# Patient Record
Sex: Male | Born: 1995 | Race: White | Hispanic: No | Marital: Single | Smoking: Never smoker
Health system: Southern US, Community
[De-identification: ages and names within clinical notes are randomized; demographics above are authoritative.]

## PROBLEM LIST (undated history)

## (undated) DIAGNOSIS — R112 Nausea with vomiting, unspecified: Secondary | ICD-10-CM

## (undated) DIAGNOSIS — S022XXA Fracture of nasal bones, initial encounter for closed fracture: Secondary | ICD-10-CM

## (undated) DIAGNOSIS — Z9889 Other specified postprocedural states: Secondary | ICD-10-CM

## (undated) DIAGNOSIS — S02401A Maxillary fracture, unspecified, initial encounter for closed fracture: Secondary | ICD-10-CM

## (undated) DIAGNOSIS — M549 Dorsalgia, unspecified: Secondary | ICD-10-CM

## (undated) DIAGNOSIS — Z8249 Family history of ischemic heart disease and other diseases of the circulatory system: Secondary | ICD-10-CM

## (undated) DIAGNOSIS — J302 Other seasonal allergic rhinitis: Secondary | ICD-10-CM

## (undated) HISTORY — DX: Family history of ischemic heart disease and other diseases of the circulatory system: Z82.49

## (undated) HISTORY — DX: Dorsalgia, unspecified: M54.9

---
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
## 2013-01-17 ENCOUNTER — Ambulatory Visit (INDEPENDENT_AMBULATORY_CARE_PROVIDER_SITE_OTHER): Payer: 59 | Admitting: Nurse Practitioner

## 2013-01-17 ENCOUNTER — Encounter: Payer: Self-pay | Admitting: Nurse Practitioner

## 2013-01-17 VITALS — BP 146/82 | HR 83 | Temp 98.4°F | Ht 69.0 in | Wt 172.0 lb

## 2013-01-17 DIAGNOSIS — R51 Headache: Secondary | ICD-10-CM

## 2013-01-17 DIAGNOSIS — J069 Acute upper respiratory infection, unspecified: Secondary | ICD-10-CM

## 2013-01-17 DIAGNOSIS — J029 Acute pharyngitis, unspecified: Secondary | ICD-10-CM

## 2013-01-17 LAB — POCT INFLUENZA A/B
Influenza A, POC: NEGATIVE
Influenza B, POC: NEGATIVE

## 2013-01-17 MED ORDER — AZITHROMYCIN 250 MG PO TABS: ORAL_TABLET | ORAL | Status: DC

## 2013-01-17 NOTE — Patient Instructions (Signed)

## 2013-01-17 NOTE — Progress Notes (Signed)
   Subjective:    Patient ID: Collin Ferguson, male    DOB: [birth year], 17 y.o.   MRN: [id number]  HPI  Patient in today with C/O sore throat and congestion, cough and neck soreness. Slight achiness-No fever that he knows of.    Review of Systems  Constitutional: Negative for fever and chills.  HENT: Positive for congestion, rhinorrhea, sinus pressure, sore throat, trouble swallowing and voice change. Negative for ear pain.   Respiratory: Positive for cough (nonproductive).   All other systems reviewed and are negative.       Objective:   Physical Exam  Constitutional: He appears well-developed and well-nourished.  HENT:  Right Ear: Hearing, tympanic membrane, external ear and ear canal normal.  Left Ear: Hearing, tympanic membrane, external ear and ear canal normal.  Nose: Mucosal edema and rhinorrhea present. Right sinus exhibits no maxillary sinus tenderness and no frontal sinus tenderness. Left sinus exhibits no maxillary sinus tenderness and no frontal sinus tenderness.  Mouth/Throat: Uvula is midline. Posterior oropharyngeal erythema present. No posterior oropharyngeal edema.  Eyes: Conjunctivae are normal. Pupils are equal, round, and reactive to light.  Neck: Normal range of motion.  Cardiovascular: Normal rate and normal heart sounds.   Pulmonary/Chest: Effort normal and breath sounds normal.  Abdominal: Soft.  Lymphadenopathy:    He has cervical adenopathy (bil tonsillar).  Neurological: He is alert.  Skin: Skin is warm.  Face flushed  Psychiatric: He has a normal mood and affect. His behavior is normal. Judgment and thought content normal.   BP 146/82  Pulse 83  Temp(Src) 98.4 F (36.9 C) (Oral)  Ht 5\' 9"  (1.753 m)  Wt 172 lb (78.019 kg)  BMI 25.39 kg/m2 Results for orders placed in visit on 01/17/13  POCT RAPID STREP A (OFFICE)      Result Value Range   Rapid Strep A Screen Negative  Negative  POCT INFLUENZA A/B      Result Value Range   Influenza A,  POC Negative     Influenza B, POC Negative            Assessment & Plan:   1. Sore throat   2. Headache(784.0)   3. Upper respiratory infection    Meds ordered this encounter  Medications  . azithromycin (ZITHROMAX Z-PAK) 250 MG tablet    Sig: As directed    Dispense:  6 each    Refill:  0    Order Specific Question:  Supervising Provider    Answer:  Ernestina Penna [[id number]]   1. Take meds as prescribed 2. Use a cool mist humidifier especially during the winter months and when heat has been humid. 3. Use saline nose sprays frequently 4. Saline irrigations of the nose can be very helpful if done frequently.  * 4X daily for 1 week*  * Use of a nettie pot can be helpful with this. Follow directions with this* 5. Drink plenty of fluids 6. Keep thermostat turn down low 7.For any cough or congestion  Use plain Mucinex- regular strength or max strength is fine   * Children- consult with Pharmacist for dosing 8. For fever or aces or pains- take tylenol or ibuprofen appropriate for age and weight.  * for fevers greater than 101 orally you may alternate ibuprofen and tylenol every  3 hours.   Mary-Margaret Daphine Deutscher, FNP

## 2013-01-19 ENCOUNTER — Encounter: Payer: Self-pay | Admitting: Nurse Practitioner

## 2013-01-19 ENCOUNTER — Ambulatory Visit (INDEPENDENT_AMBULATORY_CARE_PROVIDER_SITE_OTHER): Payer: 59 | Admitting: Nurse Practitioner

## 2013-01-19 VITALS — BP 127/74 | HR 70 | Temp 98.5°F | Ht 69.0 in | Wt 171.0 lb

## 2013-01-19 DIAGNOSIS — R509 Fever, unspecified: Secondary | ICD-10-CM

## 2013-01-19 DIAGNOSIS — J039 Acute tonsillitis, unspecified: Secondary | ICD-10-CM

## 2013-01-19 MED ORDER — CEFTRIAXONE SODIUM 1 G IJ SOLR
1.0000 g | INTRAMUSCULAR | Status: AC
  Administered 2013-01-19: 1 g via INTRAMUSCULAR

## 2013-01-19 NOTE — Progress Notes (Signed)
   Subjective:    Patient ID: Collin Ferguson, male    DOB: September 27, 1995, 17 y.o.   MRN: [id number]  HPI  Patient in today- had been seen 2 days ago with sore throat and fever- strep and flu were negative- He was given a  zpak anyway- no better- has sores on his throat and can't hardly swallow. Still has fever.    Review of Systems  Constitutional: Positive for fever and appetite change.  HENT: Positive for congestion, sore throat, trouble swallowing and voice change.   Respiratory: Positive for cough (slight).   Cardiovascular: Negative.   Gastrointestinal: Negative.        Objective:   Physical Exam  Constitutional: He appears well-developed and well-nourished.  HENT:  Right Ear: Hearing, tympanic membrane, external ear and ear canal normal.  Left Ear: Hearing, tympanic membrane, external ear and ear canal normal.  Nose: Mucosal edema and rhinorrhea present. Right sinus exhibits no maxillary sinus tenderness and no frontal sinus tenderness. Left sinus exhibits no maxillary sinus tenderness and no frontal sinus tenderness.  Mouth/Throat: Uvula is midline and mucous membranes are normal. Oropharyngeal exudate, posterior oropharyngeal edema, posterior oropharyngeal erythema and tonsillar abscesses present.  Cardiovascular: Normal rate, regular rhythm and normal heart sounds.   Pulmonary/Chest: Effort normal and breath sounds normal.  Skin: Skin is warm and dry.    BP 127/74  Pulse 70  Temp(Src) 98.5 F (36.9 C) (Oral)  Ht 5\' 9"  (1.753 m)  Wt 171 lb (77.565 kg)  BMI 25.24 kg/m2 Results for orders placed in visit on 01/19/13  POCT RAPID STREP A (OFFICE)      Result Value Range   Rapid Strep A Screen Negative  Negative         Assessment & Plan:   1. Fever, unspecified   2. Tonsillitis with exudate    Meds ordered this encounter  Medications  . cefTRIAXone (ROCEPHIN) injection 1 g    Sig:    Force fluids Gargle with salt water Motrin OTC  RTO  prn  Mary-Margaret Daphine Deutscher, FNP

## 2013-01-19 NOTE — Patient Instructions (Signed)

## 2013-05-01 ENCOUNTER — Encounter: Payer: Self-pay | Admitting: *Deleted

## 2013-05-01 ENCOUNTER — Telehealth: Payer: Self-pay | Admitting: *Deleted

## 2013-05-01 ENCOUNTER — Encounter: Payer: Self-pay | Admitting: Family Medicine

## 2013-05-01 ENCOUNTER — Ambulatory Visit (INDEPENDENT_AMBULATORY_CARE_PROVIDER_SITE_OTHER): Payer: 59 | Admitting: Family Medicine

## 2013-05-01 VITALS — BP 140/83 | HR 76 | Temp 98.2°F | Ht 69.0 in | Wt 167.2 lb

## 2013-05-01 DIAGNOSIS — J309 Allergic rhinitis, unspecified: Secondary | ICD-10-CM

## 2013-05-01 NOTE — Telephone Encounter (Signed)
Patient will come in for visit this afternoon further note to be written for class

## 2013-05-01 NOTE — Telephone Encounter (Signed)
Pt has allergies, headache, watery eyes, no fever. Taking OTC meds. Wants a excuse for today and will make appt for tom if not feeling better. Is this okay?

## 2013-05-01 NOTE — Progress Notes (Signed)
   Subjective:    Patient ID: Collin Ferguson, male    DOB: [birth year], 18 y.o.   MRN: [id number]  HPI Patient here today for sinus and allergies. The patient has not started back on his Claritin at this point in time.      There are no active problems to display for this patient.  Outpatient Encounter Prescriptions as of 05/01/2013  Medication Sig  . loratadine (CLARITIN) 10 MG tablet Take 10 mg by mouth daily.  . [DISCONTINUED] azithromycin (ZITHROMAX Z-PAK) 250 MG tablet As directed    Review of Systems  Constitutional: Negative.   HENT: Positive for congestion and sinus pressure.        Allergies  Eyes: Negative.   Respiratory: Negative.   Cardiovascular: Negative.   Gastrointestinal: Negative.   Endocrine: Negative.   Genitourinary: Negative.   Musculoskeletal: Negative.   Skin: Negative.   Allergic/Immunologic: Negative.   Neurological: Negative.   Hematological: Negative.   Psychiatric/Behavioral: Negative.        Objective:   Physical Exam  Nursing note and vitals reviewed. Constitutional: He appears well-developed and well-nourished. No distress.  HENT:  Head: Normocephalic and atraumatic.  Right Ear: External ear normal.  Left Ear: External ear normal.  Mouth/Throat: Oropharynx is clear and moist. No oropharyngeal exudate.  Nasal turbinate swelling and edema bilaterally there was some bleeding from the inferior turbinate on the right side  Eyes: Conjunctivae and EOM are normal. Pupils are equal, round, and reactive to light. Right eye exhibits no discharge. Left eye exhibits no discharge. No scleral icterus.  Neck: Normal range of motion. Neck supple. No thyromegaly present.  Cardiovascular: Normal rate, regular rhythm, normal heart sounds and intact distal pulses.  Exam reveals no gallop and no friction rub.   No murmur heard. Pulmonary/Chest: Effort normal and breath sounds normal. No respiratory distress. He has no wheezes. He has no rales. He  exhibits no tenderness.  Abdominal: Soft. Bowel sounds are normal.  Musculoskeletal: Normal range of motion.  Lymphadenopathy:    He has no cervical adenopathy.  Neurological: He is alert.  Skin: Skin is warm and dry. No rash noted.  Psychiatric: He has a normal mood and affect. His behavior is normal. Judgment and thought content normal.   BP 140/83  Pulse 76  Temp(Src) 98.2 F (36.8 C) (Oral)  Ht 5\' 9"  (1.753 m)  Wt 167 lb 3.2 oz (75.841 kg)  BMI 24.68 kg/m2        Assessment & Plan:   1. Allergic rhinitis Patient Instructions  Use saline nose spray,ayr, frequently through the day to hydrate here nasal passages You can purchase Flonase  over-the-counter 1-2 sprays at bedtime nightly Restart her Claritin taking one daily in the morning Drink plenty of fluids   Nyra Capeson W. Mia Milan MD

## 2013-05-01 NOTE — Telephone Encounter (Signed)
Seen in office.

## 2013-05-01 NOTE — Patient Instructions (Signed)
Use saline nose spray,ayr, frequently through the day to hydrate here nasal passages You can purchase Flonase  over-the-counter 1-2 sprays at bedtime nightly Restart her Claritin taking one daily in the morning Drink plenty of fluids

## 2013-08-10 ENCOUNTER — Ambulatory Visit (INDEPENDENT_AMBULATORY_CARE_PROVIDER_SITE_OTHER): Payer: 59 | Admitting: *Deleted

## 2013-08-10 ENCOUNTER — Other Ambulatory Visit: Payer: 59

## 2013-08-10 ENCOUNTER — Ambulatory Visit: Payer: 59 | Admitting: Nurse Practitioner

## 2013-08-10 ENCOUNTER — Other Ambulatory Visit: Payer: Self-pay | Admitting: *Deleted

## 2013-08-10 DIAGNOSIS — Z130 Encounter for screening for diseases of the blood and blood-forming organs and certain disorders involving the immune mechanism: Secondary | ICD-10-CM

## 2013-08-10 DIAGNOSIS — Z23 Encounter for immunization: Secondary | ICD-10-CM

## 2013-08-13 LAB — SICKLE CELL SCREEN

## 2013-08-13 NOTE — Addendum Note (Signed)
Addended by: Orma RenderHODGES, Zamariyah Furukawa F on: 08/13/2013 10:07 AM   Modules accepted: Orders

## 2013-08-14 LAB — SICKLE CELL SCREEN: Sickle Cell Prep: NEGATIVE

## 2013-08-30 ENCOUNTER — Encounter: Payer: Self-pay | Admitting: Physician Assistant

## 2013-08-30 ENCOUNTER — Ambulatory Visit (INDEPENDENT_AMBULATORY_CARE_PROVIDER_SITE_OTHER): Payer: 59 | Admitting: Physician Assistant

## 2013-08-30 VITALS — BP 126/79 | HR 66 | Temp 97.5°F | Ht 68.5 in | Wt 166.0 lb

## 2013-08-30 DIAGNOSIS — Z0000 Encounter for general adult medical examination without abnormal findings: Secondary | ICD-10-CM

## 2013-08-30 NOTE — Progress Notes (Signed)
Subjective:     Patient ID: Collin Ferguson, male   DOB: 11-05-1995, 18 y.o.   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   HENT: Negative.   Eyes: Negative.   Respiratory: Negative.   Cardiovascular: Negative.   Gastrointestinal: Negative.   Endocrine: Negative.   Genitourinary: Negative.   Musculoskeletal: Negative.   Skin: Negative.   Allergic/Immunologic: Negative.   Neurological: Negative.   Hematological: Negative.   Psychiatric/Behavioral: Negative.        Objective:   Physical Exam  Nursing note and vitals reviewed. Constitutional: He is oriented to person, place, and time. He appears well-developed and well-nourished.  HENT:  Head: Normocephalic.  Right Ear: External ear normal.  Left Ear: External ear normal.  Nose: Nose normal.  Mouth/Throat: Oropharynx is clear and moist.  Eyes: Conjunctivae and EOM are normal. Pupils are equal, round, and reactive to light.  Neck: Normal range of motion. Neck supple.  Cardiovascular: Normal rate, regular rhythm, normal heart sounds and intact distal pulses.   Pulmonary/Chest: Breath sounds normal.  Abdominal: Soft. Bowel sounds are normal. He exhibits no mass. There is no tenderness.  Genitourinary: Penis normal.  Testes down no hernia  Musculoskeletal: Normal range of motion. He exhibits no tenderness.  Lymphadenopathy:    He has no cervical adenopathy.  Neurological: He is alert and oriented to person, place, and time. He has normal reflexes.  Skin: Skin is warm and dry.  Psychiatric: He has a normal mood and affect. His behavior is normal. Judgment and thought content normal.       Assessment:     Physical exam    Plan:     Forms filled out today Full  activities Anticip guidance given for age Stressed the importance of hydration F/U prn

## 2013-08-30 NOTE — Patient Instructions (Signed)
Exercise to Stay Healthy Exercise helps you become and stay healthy. EXERCISE IDEAS AND TIPS Choose exercises that:  You enjoy.  Fit into your day. You do not need to exercise really hard to be healthy. You can do exercises at a slow or medium level and stay healthy. You can:  Stretch before and after working out.  Try yoga, Pilates, or tai chi.  Lift weights.  Walk fast, swim, jog, run, climb stairs, bicycle, dance, or rollerskate.  Take aerobic classes. Exercises that burn about 150 calories:  Running 1  miles in 15 minutes.  Playing volleyball for 45 to 60 minutes.  Washing and waxing a car for 45 to 60 minutes.  Playing touch football for 45 minutes.  Walking 1  miles in 35 minutes.  Pushing a stroller 1  miles in 30 minutes.  Playing basketball for 30 minutes.  Raking leaves for 30 minutes.  Bicycling 5 miles in 30 minutes.  Walking 2 miles in 30 minutes.  Dancing for 30 minutes.  Shoveling snow for 15 minutes.  Swimming laps for 20 minutes.  Walking up stairs for 15 minutes.  Bicycling 4 miles in 15 minutes.  Gardening for 30 to 45 minutes.  Jumping rope for 15 minutes.  Washing windows or floors for 45 to 60 minutes. Document Released: 02/20/2010 Document Revised: 04/12/2011 Document Reviewed: 02/20/2010 Exit[employer] Patient Information 2015 [employer]. This information is not intended to replace advice given to you by your health care provider. Make sure you discuss any questions you have with your health care provider.  

## 2013-11-14 ENCOUNTER — Ambulatory Visit (INDEPENDENT_AMBULATORY_CARE_PROVIDER_SITE_OTHER): Payer: 59

## 2013-11-14 DIAGNOSIS — Z23 Encounter for immunization: Secondary | ICD-10-CM

## 2014-11-22 ENCOUNTER — Ambulatory Visit (INDEPENDENT_AMBULATORY_CARE_PROVIDER_SITE_OTHER): Payer: 59

## 2014-11-22 DIAGNOSIS — Z23 Encounter for immunization: Secondary | ICD-10-CM | POA: Diagnosis not present

## 2014-12-30 IMAGING — CT CT MAXILLOFACIAL W/O CM
3 of 4 series · 15 of 47 positions shown, 18 images · non-contrast
Comparison: None.

CLINICAL DATA: Hit in left eye with baseball; bloody nose.

CT MAXILLOFACIAL WITHOUT CONTRAST
TECHNIQUE: Multidetector CT imaging of the maxillofacial
structures was performed. Multiplanar CT image reconstructions were
also generated.

[Series 3: orbit/facial 2.0 h30s · axial · 0.37mm/px · z∈[+1099,+1225]mm · 10 of 75 slices shown, 13 images]
[im 6/75  brain]
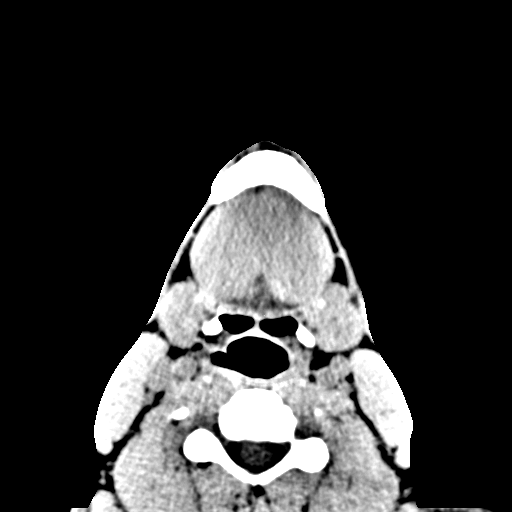
[im 6/75  bone]
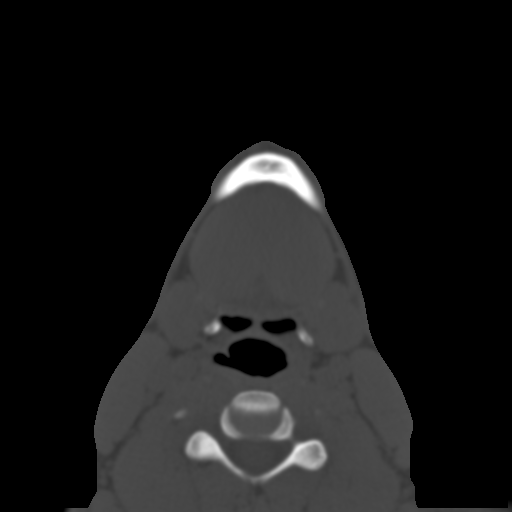
[im 13/75  bone]
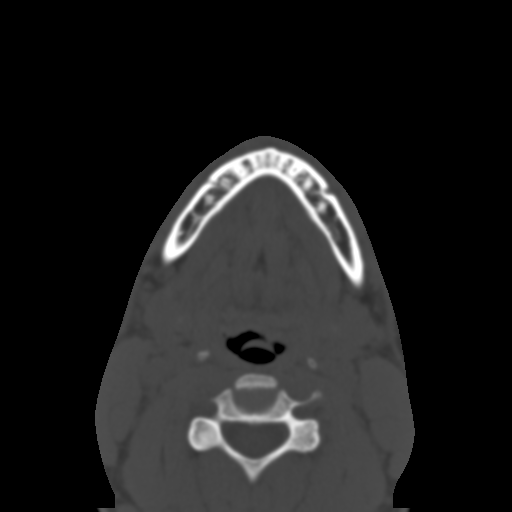
[im 21/75  bone]
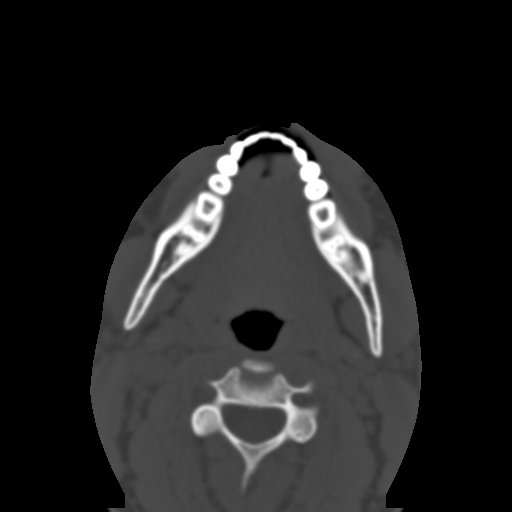
[im 26/75  bone]
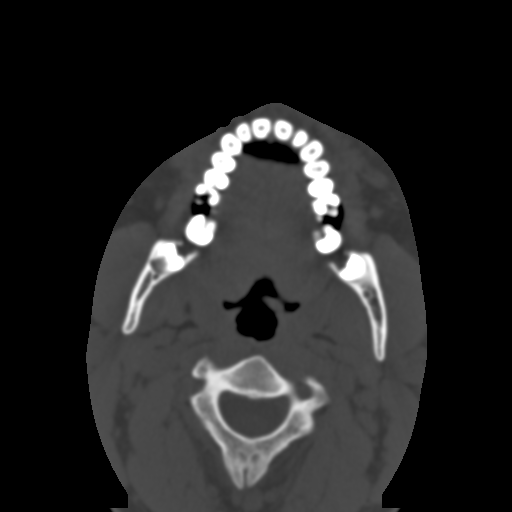
[im 34/75  brain]
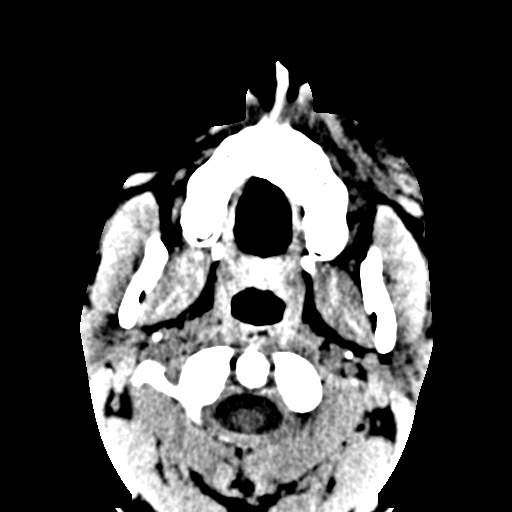
[im 34/75  bone]
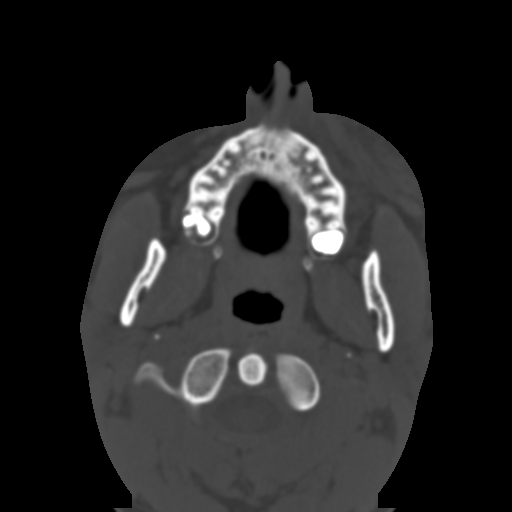
[im 41/75  bone]
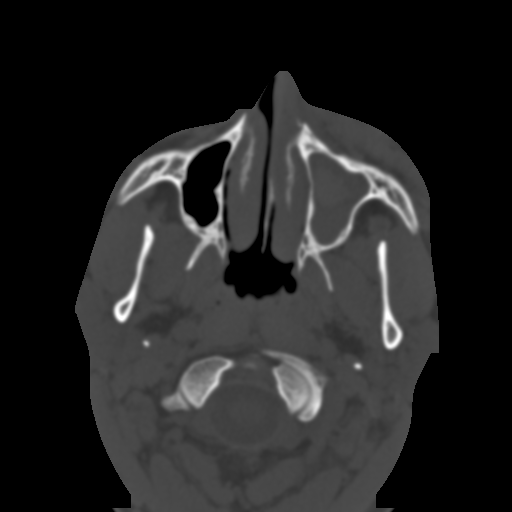
[im 49/75  bone]
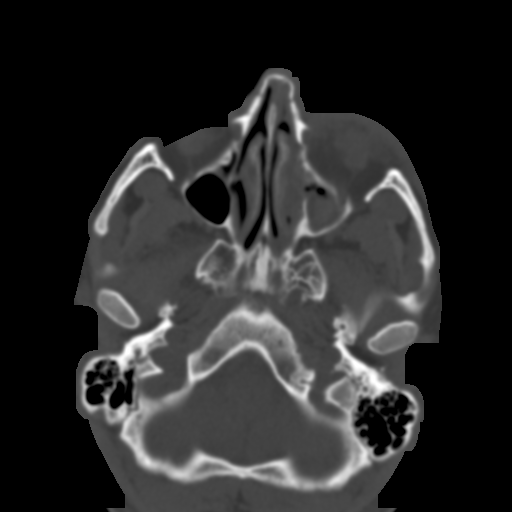
[im 57/75  bone]
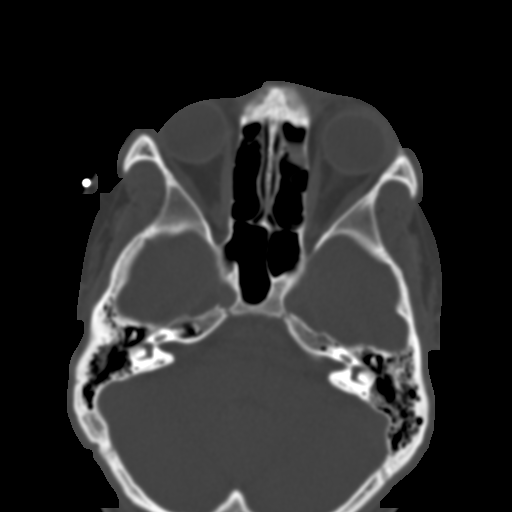
[im 62/75  brain]
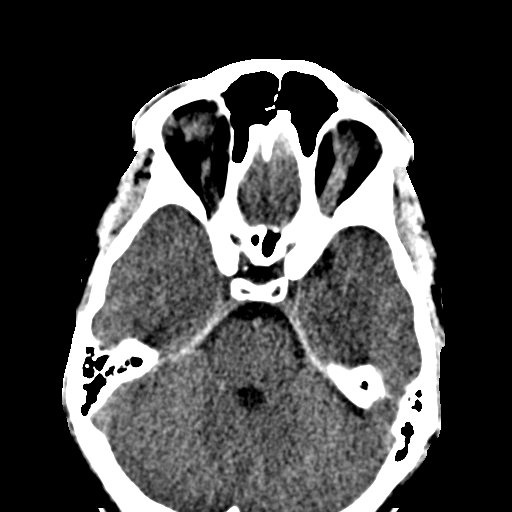
[im 62/75  bone]
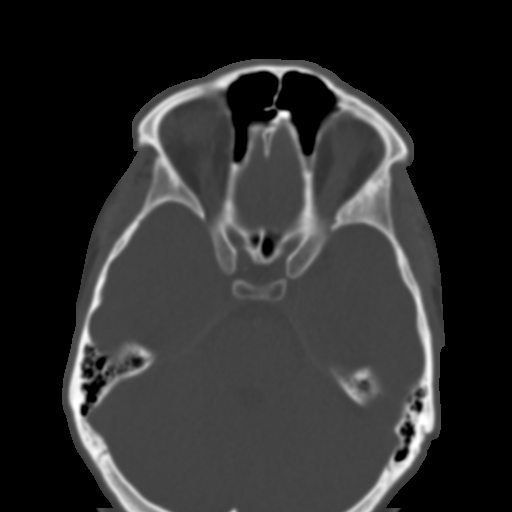
[im 69/75  bone]
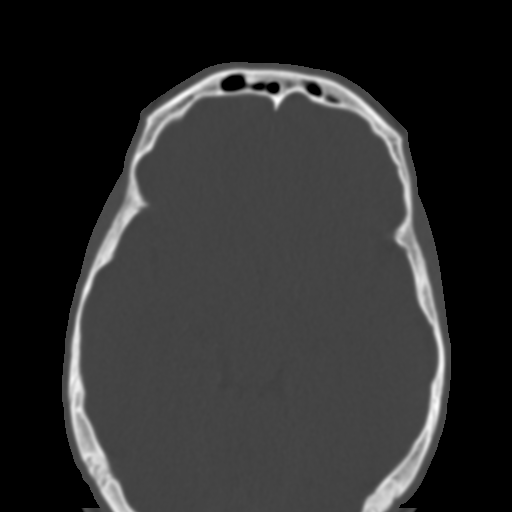

[Series 7: coronals · coronal · 0.30mm/px · 3 of 80 slices shown]
[im 27/80  bone]
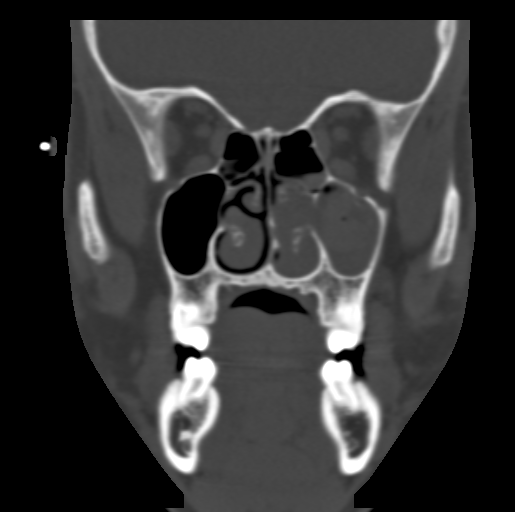
[im 36/80  bone]
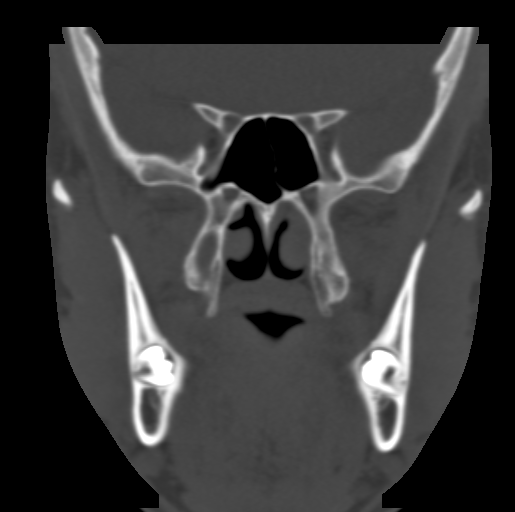
[im 44/80  bone]
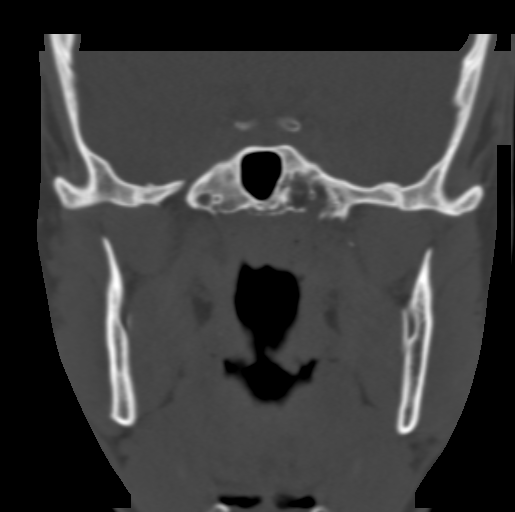

[Series 10: sagittals bone · sagittal · 0.30mm/px · 2 of 75 slices shown]
[im 25/75  bone]
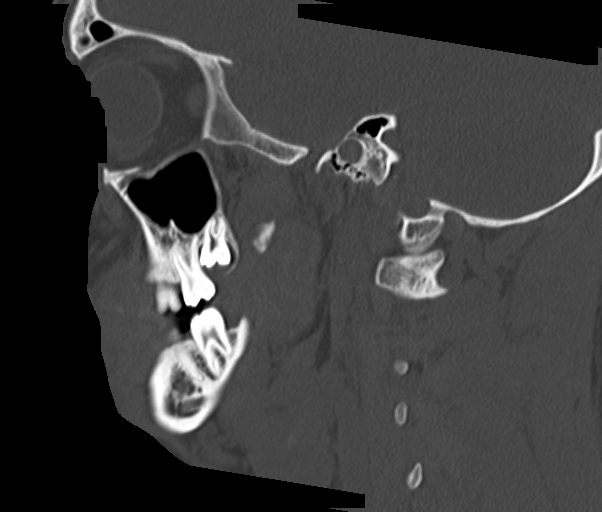
[im 50/75  bone]
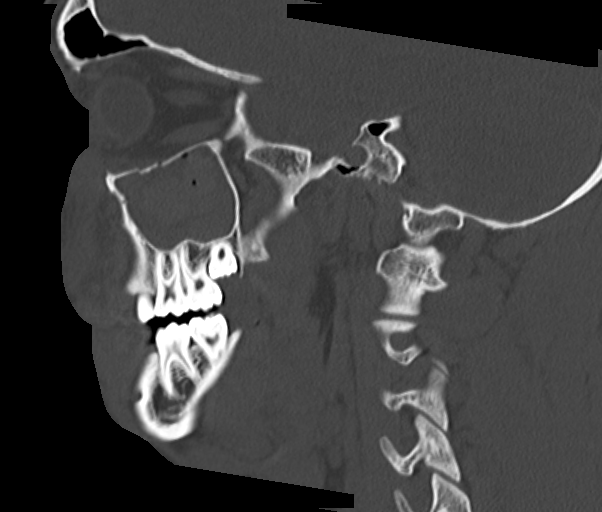

[15 of 47 positions shown; findings below may reference images not displayed]

FINDINGS: There is a mildly depressed fracture involving the left
orbital floor, with extension of the fracture along the anterior
aspect of the left maxillary sinus to the medial wall of the
maxillary sinus.  There is also an adjacent fracture involving the
left side of the nasal bone.

Blood is noted filling the left maxillary sinus.  Trace blood is
noted along the inferior aspect of the left orbit, without a
significant hematoma.  There is no evidence of herniation of
intraorbital fat.  There is also partial opacification of the left-
sided nasal passages with blood.

The mandible appears intact.  The visualized dentition demonstrates
no acute abnormality.

The right orbit appears intact.  The remaining visualized paranasal
sinuses and mastoid air cells are well-aerated.

Significant soft tissue swelling is noted about the left orbit and
anterior to the left maxilla.  The parapharyngeal fat planes are
preserved.  The nasopharynx, oropharynx and hypopharynx are
unremarkable in appearance.  The visualized portions of the
valleculae and piriform sinuses are grossly unremarkable.

The parotid and submandibular glands are within normal limits.  No
cervical lymphadenopathy is seen.
IMPRESSION: 1.  Mildly depressed fracture involving the left orbital floor,
with extension of the fracture line across the anterior aspect of
the left maxillary sinus to the medial wall of the maxillary sinus.
2.  Adjacent fracture involving the left side of the nasal bone,
with minimal displacement.
3.  Blood noted filling the left maxillary sinus; trace blood along
the inferior aspect of the left orbit, without significant
hematoma.  No evidence of herniation of intraorbital fat.  Partial
opacification of the left-sided nasal passages with blood.
4.  Significant soft tissue swelling about the left orbit and
anterior to the left maxilla.

to Dr. Vulovic Solidoro, who verbally acknowledged these results.

## 2015-01-14 ENCOUNTER — Ambulatory Visit (INDEPENDENT_AMBULATORY_CARE_PROVIDER_SITE_OTHER): Payer: 59 | Admitting: Family Medicine

## 2015-01-14 ENCOUNTER — Encounter: Payer: Self-pay | Admitting: Family Medicine

## 2015-01-14 VITALS — BP 121/65 | HR 62 | Temp 97.3°F | Ht 68.5 in | Wt 186.0 lb

## 2015-01-14 DIAGNOSIS — J0100 Acute maxillary sinusitis, unspecified: Secondary | ICD-10-CM | POA: Diagnosis not present

## 2015-01-14 DIAGNOSIS — J029 Acute pharyngitis, unspecified: Secondary | ICD-10-CM

## 2015-01-14 LAB — POCT RAPID STREP A (OFFICE): Rapid Strep A Screen: NEGATIVE

## 2015-01-14 MED ORDER — AMOXICILLIN-POT CLAVULANATE 875-125 MG PO TABS: 1.0000 | ORAL_TABLET | Freq: Two times a day (BID) | ORAL | Status: DC

## 2015-01-14 MED ORDER — PSEUDOEPHEDRINE-GUAIFENESIN ER 120-1200 MG PO TB12: 1.0000 | ORAL_TABLET | Freq: Two times a day (BID) | ORAL | Status: DC

## 2015-01-14 MED ORDER — BETAMETHASONE SOD PHOS & ACET 6 (3-3) MG/ML IJ SUSP
6.0000 mg | Freq: Once | INTRAMUSCULAR | Status: AC
  Administered 2015-01-14: 6 mg via INTRAMUSCULAR

## 2015-01-14 NOTE — Addendum Note (Signed)
Addended by: Prescott GumLAND, Teja Costen M on: 01/14/2015 05:23 PM   Modules accepted: Orders, SmartSet

## 2015-01-14 NOTE — Progress Notes (Signed)
Subjective:  Patient ID: Collin Ferguson, male    DOB: February 02, 1995  Age: 19 y.o. MRN: [id number]  CC: URI   HPI Collin IbaCharles D Roehr presents for Patient presents with upper respiratory congestion. Rhinorrhea that is frequently purulent. There is moderate sore throat. Patient reports only minimal cough. There are chills and sweats. The patient denies being short of breath. Onset was 3 days ago. Gradually worsening. Was outside 3 days ago all day hunting while weather was turning colder.  History Collin Ferguson has a past medical history of Seasonal allergies; PONV (postoperative nausea and vomiting); Maxillary fracture (HCC) (04/05/2012); and Nasal fracture (04/05/2012).   He has past surgical history that includes Femoral traction pin (Left, 02/14/1999); Extremity wire/pin removal (Left, 03/12/1999); Closed reduction nasal fracture (N/A, 04/11/2012); and Turbinate reduction (N/A, 04/11/2012).   His family history includes Heart disease in his father; Hypertension in his father.He reports that he has never smoked. He has never used smokeless tobacco. He reports that he does not drink alcohol or use illicit drugs.  No current outpatient prescriptions on file prior to visit.   No current facility-administered medications on file prior to visit.    ROS Review of Systems  Constitutional: Negative for fever, chills, activity change and appetite change.  HENT: Positive for congestion, ear pain (mostly getting a popping sensation frequently.), postnasal drip, rhinorrhea and sinus pressure. Negative for ear discharge, hearing loss, nosebleeds, sneezing and trouble swallowing.   Respiratory: Negative for cough, chest tightness and shortness of breath.   Cardiovascular: Negative for chest pain and palpitations.  Skin: Negative for rash.    Objective:  BP 121/65 mmHg  Pulse 62  Temp(Src) 97.3 F (36.3 C) (Oral)  Ht 5' 8.5" (1.74 m)  Wt 186 lb (84.369 kg)  BMI 27.87 kg/m2  SpO2 99%  Physical Exam    Constitutional: He appears well-developed and well-nourished.  HENT:  Head: Normocephalic and atraumatic.  Right Ear: External ear normal. A middle ear effusion is present. No decreased hearing is noted.  Left Ear: Tympanic membrane and external ear normal. No decreased hearing is noted.  Nose: Mucosal edema (with marked erythema and exudate) present. Right sinus exhibits no frontal sinus tenderness. Left sinus exhibits no frontal sinus tenderness.  Mouth/Throat: Mucous membranes are normal. Oropharyngeal exudate, posterior oropharyngeal edema and posterior oropharyngeal erythema present. No tonsillar abscesses.  Neck: No Brudzinski's sign noted.  Pulmonary/Chest: Breath sounds normal. No respiratory distress.  Lymphadenopathy:       Head (right side): No preauricular adenopathy present.       Head (left side): No preauricular adenopathy present.    He has cervical adenopathy.   Results for orders placed or performed in visit on 01/14/15  POCT rapid strep A  Result Value Ref Range   Rapid Strep A Screen Negative Negative    Assessment & Plan:   Collin Ferguson was seen today for uri.  Diagnoses and all orders for this visit:  Sore throat -     POCT rapid strep A -     betamethasone acetate-betamethasone sodium phosphate (CELESTONE) injection 6 mg; Inject 1 mL (6 mg total) into the muscle once.  Acute maxillary sinusitis, recurrence not specified -     betamethasone acetate-betamethasone sodium phosphate (CELESTONE) injection 6 mg; Inject 1 mL (6 mg total) into the muscle once.  Other orders -     amoxicillin-clavulanate (AUGMENTIN) 875-125 MG tablet; Take 1 tablet by mouth 2 (two) times daily. Take all of this medication -  Pseudoephedrine-Guaifenesin [id number] MG TB12; Take 1 tablet by mouth 2 (two) times daily. For congestion   I am having Mr. Malter start on amoxicillin-clavulanate and Pseudoephedrine-Guaifenesin. We will continue to administer betamethasone  acetate-betamethasone sodium phosphate.  Meds ordered this encounter  Medications  . amoxicillin-clavulanate (AUGMENTIN) 875-125 MG tablet    Sig: Take 1 tablet by mouth 2 (two) times daily. Take all of this medication    Dispense:  20 tablet    Refill:  0  . Pseudoephedrine-Guaifenesin [id number] MG TB12    Sig: Take 1 tablet by mouth 2 (two) times daily. For congestion    Dispense:  20 each    Refill:  0  . betamethasone acetate-betamethasone sodium phosphate (CELESTONE) injection 6 mg    Sig:      Follow-up: Return if symptoms worsen or fail to improve.  Mechele Claude, M.D.

## 2015-01-14 NOTE — Addendum Note (Signed)
Addended by: Bearl MulberryUTHERFORD, NATALIE K on: 01/14/2015 01:55 PM   Modules accepted: Kipp BroodSmartSet

## 2015-01-16 LAB — CULTURE, GROUP A STREP

## 2015-01-17 NOTE — Progress Notes (Signed)
Patient aware.

## 2016-06-02 ENCOUNTER — Encounter: Payer: Self-pay | Admitting: Family Medicine

## 2016-06-02 ENCOUNTER — Ambulatory Visit (INDEPENDENT_AMBULATORY_CARE_PROVIDER_SITE_OTHER): Payer: 59 | Admitting: Family Medicine

## 2016-06-02 VITALS — BP 129/87 | HR 104 | Temp 98.0°F | Ht 68.0 in | Wt 175.0 lb

## 2016-06-02 DIAGNOSIS — J301 Allergic rhinitis due to pollen: Secondary | ICD-10-CM | POA: Diagnosis not present

## 2016-06-02 MED ORDER — FEXOFENADINE-PSEUDOEPHED ER 180-240 MG PO TB24: 1.0000 | ORAL_TABLET | Freq: Every evening | ORAL | 11 refills | Status: AC

## 2016-06-02 MED ORDER — FLUOCINONIDE-E 0.05 % EX CREA: 1.0000 "application " | TOPICAL_CREAM | Freq: Two times a day (BID) | CUTANEOUS | 5 refills | Status: DC

## 2016-06-02 MED ORDER — BETAMETHASONE SOD PHOS & ACET 6 (3-3) MG/ML IJ SUSP: 6.0000 mg | Freq: Once | INTRAMUSCULAR | Status: DC

## 2016-06-02 NOTE — Progress Notes (Addendum)
Subjective:  Patient ID: Kegan Shepardson LINDAHL, male    DOB: [birth year]  Age: 21 y.o. MRN: [id number]  CC: Sinusitis (pt here today c/o runny nose, sneezing, itchy/watery eyes and swelling under his eyes.)   HPI JEDRICK HUTCHERSON presents for Patient has allergic rhinitis symptoms including sneezing frequently sniffling, clear rhinorrhea, watery and itchy eyes. There has been no fever no chills no sweats. No earaches. There is some scratchy throat but no sore throat or difficulty swallowing. There is some nasal congestion. He also has from patchy rash on the neck. Patient says he gets these symptoms every year in the spring. He's tried Claritin and Zyrtec as well as several others.  History Robertlee has a past medical history of Maxillary fracture (HCC) (04/05/2012); Nasal fracture (04/05/2012); PONV (postoperative nausea and vomiting); and Seasonal allergies.   He has a past surgical history that includes Femoral traction pin (Left, 02/14/1999); Extremity wire/pin removal (Left, 03/12/1999); Closed reduction nasal fracture (N/A, 04/11/2012); and Turbinate reduction (N/A, 04/11/2012).   His family history includes Heart disease in his father; Hypertension in his father.He reports that he has never smoked. He has never used smokeless tobacco. He reports that he does not drink alcohol or use drugs.  No current outpatient prescriptions on file prior to visit.   No current facility-administered medications on file prior to visit.     ROS Review of Systems  Constitutional: Negative for chills, diaphoresis and fever.  HENT: Positive for rhinorrhea and sneezing.   Eyes: Positive for redness and itching. Negative for visual disturbance.  Respiratory: Negative for cough and shortness of breath.   Cardiovascular: Negative for chest pain.  Gastrointestinal: Negative for abdominal pain.  Musculoskeletal: Negative for arthralgias and myalgias.  Skin: Negative for rash.  Neurological: Negative for weakness  and headaches.    Objective:  BP 129/87   Pulse (!) 104   Temp 98 F (36.7 C) (Oral)   Ht  (1.727 m)   Wt 175 lb (79.4 kg)   BMI 26.61 kg/m   Physical Exam  Constitutional: He is oriented to person, place, and time. He appears well-developed and well-nourished.  HENT:  Head: Normocephalic and atraumatic.  Right Ear: External ear normal.  Left Ear: External ear normal.  Mouth/Throat: No oropharyngeal exudate or posterior oropharyngeal erythema.  Eyes: Pupils are equal, round, and reactive to light.  Neck: Normal range of motion. Neck supple.  Cardiovascular: Normal rate and regular rhythm.   No murmur heard. Pulmonary/Chest: Breath sounds normal. No respiratory distress.  Neurological: He is alert and oriented to person, place, and time.  Vitals reviewed.   Assessment & Plan:   Gearald was seen today for sinusitis.  Diagnoses and all orders for this visit:  Seasonal allergic rhinitis due to pollen -     betamethasone acetate-betamethasone sodium phosphate (CELESTONE) injection 6 mg; Inject 1 mL (6 mg total) into the muscle once.  Other orders -     fexofenadine-pseudoephedrine (ALLEGRA-D 24) 180-240 MG 24 hr tablet; Take 1 tablet by mouth every evening. For allergy and congestion -     fluocinonide-emollient (LIDEX-E) 0.05 % cream; Apply 1 application topically 2 (two) times daily. To affected areas   I have discontinued Mr. Bowdish amoxicillin-clavulanate and Pseudoephedrine-Guaifenesin. I am also having him start on fexofenadine-pseudoephedrine and fluocinonide-emollient. We will continue to administer betamethasone acetate-betamethasone sodium phosphate.  Meds ordered this encounter  Medications  . betamethasone acetate-betamethasone sodium phosphate (CELESTONE) injection 6 mg  . fexofenadine-pseudoephedrine (ALLEGRA-D 24) 180-240 MG 24  hr tablet    Sig: Take 1 tablet by mouth every evening. For allergy and congestion    Dispense:  30 tablet    Refill:  11   . fluocinonide-emollient (LIDEX-E) 0.05 % cream    Sig: Apply 1 application topically 2 (two) times daily. To affected areas    Dispense:  60 g    Refill:  5     Follow-up: Return if symptoms worsen or fail to improve.  Mechele Claude, M.D.

## 2016-06-02 NOTE — Addendum Note (Signed)
Addended by: Mechele Claude on: 06/02/2016 03:39 PM   Modules accepted: Orders

## 2016-08-02 DIAGNOSIS — M9903 Segmental and somatic dysfunction of lumbar region: Secondary | ICD-10-CM | POA: Diagnosis not present

## 2016-08-03 DIAGNOSIS — M9903 Segmental and somatic dysfunction of lumbar region: Secondary | ICD-10-CM | POA: Diagnosis not present

## 2016-08-05 DIAGNOSIS — M9903 Segmental and somatic dysfunction of lumbar region: Secondary | ICD-10-CM | POA: Diagnosis not present

## 2016-08-09 DIAGNOSIS — M9903 Segmental and somatic dysfunction of lumbar region: Secondary | ICD-10-CM | POA: Diagnosis not present

## 2016-08-11 DIAGNOSIS — M9903 Segmental and somatic dysfunction of lumbar region: Secondary | ICD-10-CM | POA: Diagnosis not present

## 2016-08-12 DIAGNOSIS — M9903 Segmental and somatic dysfunction of lumbar region: Secondary | ICD-10-CM | POA: Diagnosis not present

## 2016-08-16 DIAGNOSIS — M9903 Segmental and somatic dysfunction of lumbar region: Secondary | ICD-10-CM | POA: Diagnosis not present

## 2016-08-19 DIAGNOSIS — M9903 Segmental and somatic dysfunction of lumbar region: Secondary | ICD-10-CM | POA: Diagnosis not present

## 2016-08-20 ENCOUNTER — Ambulatory Visit (INDEPENDENT_AMBULATORY_CARE_PROVIDER_SITE_OTHER): Payer: 59

## 2016-08-20 ENCOUNTER — Encounter: Payer: Self-pay | Admitting: Family Medicine

## 2016-08-20 ENCOUNTER — Ambulatory Visit (INDEPENDENT_AMBULATORY_CARE_PROVIDER_SITE_OTHER): Payer: 59 | Admitting: Family Medicine

## 2016-08-20 VITALS — BP 123/74 | HR 60 | Temp 96.9°F | Ht 68.0 in | Wt 180.0 lb

## 2016-08-20 DIAGNOSIS — S9031XA Contusion of right foot, initial encounter: Secondary | ICD-10-CM | POA: Diagnosis not present

## 2016-08-20 NOTE — Progress Notes (Signed)
BP 123/74   Pulse 60   Temp (!) 96.9 F (36.1 C) (Oral)   Ht 5\' 8"  (1.727 m)   Wt 180 lb (81.6 kg)   BMI 27.37 kg/m    Subjective:    Patient ID: Collin Ferguson, male    DOB: 02-Aug-1995, 21 y.o.   MRN: [id number]  HPI: Collin Ferguson is a 21 y.o. male presenting on 08/20/2016 for Right foot pain (pain in top of right foot, putting in fence posts and slammed fence post and drive onto top of right foot)   HPI Right foot Pain Patient has been having right foot pain since yesterday when he was driving fence posts and axilla to the driver too high and came down on top of his right foot. He is able to weight-bear on it but it is painful on the medial top aspect of the foot where he landed. He does have a little bit of a scrape there. The pain is moderate today. He denies any fevers or chills or numbness or weakness. He denies any pain radiating anywhere else. He mainly has pain with movement of the foot and when he is putting pressure to push off of his toes from that foot.  Relevant past medical, surgical, family and social history reviewed and updated as indicated. Interim medical history since our last visit reviewed. Allergies and medications reviewed and updated.  Review of Systems  Constitutional: Negative for chills and fever.  Respiratory: Negative for shortness of breath and wheezing.   Cardiovascular: Negative for chest pain and leg swelling.  Musculoskeletal: Positive for arthralgias. Negative for back pain and gait problem.  Skin: Positive for color change and wound. Negative for rash.  All other systems reviewed and are negative.   Per HPI unless specifically indicated above   Allergies as of 08/20/2016   No Known Allergies     Medication List       Accurate as of 08/20/16  9:59 AM. Always use your most recent med list.          fexofenadine-pseudoephedrine 180-240 MG 24 hr tablet Commonly known as:  ALLEGRA-D 24 Take 1 tablet by mouth every evening.  For allergy and congestion          Objective:    BP 123/74   Pulse 60   Temp (!) 96.9 F (36.1 C) (Oral)   Ht 5\' 8"  (1.727 m)   Wt 180 lb (81.6 kg)   BMI 27.37 kg/m   Wt Readings from Last 3 Encounters:  08/20/16 180 lb (81.6 kg)  06/02/16 175 lb (79.4 kg)  01/14/15 186 lb (84.4 kg)    Physical Exam  Constitutional: He is oriented to person, place, and time. He appears well-developed and well-nourished. No distress.  Eyes: Conjunctivae are normal. No scleral icterus.  Musculoskeletal: Normal range of motion. He exhibits no edema.       Right foot: There is tenderness, bony tenderness (Tenderness overlying the first and second med and tarsals on top of his foot but not as much pain underneath) and swelling. There is normal range of motion, normal capillary refill, no deformity and no laceration.       Feet:  Neurological: He is alert and oriented to person, place, and time. Coordination normal.  Skin: Skin is warm and dry. No rash noted. He is not diaphoretic.  Psychiatric: He has a normal mood and affect. His behavior is normal.  Nursing note and vitals reviewed.   Right foot x-ray:  Right foot x-ray shows no sign of fractures or acute bony abnormalities. Await final read from radiologist    Assessment & Plan:   Problem List Items Addressed This Visit    None    Visit Diagnoses    Contusion of right foot, initial encounter    -  Primary   No sign of fracture seen on x-ray, await final read from radiologist, instructed patient to ice and elevate over the weekend   Relevant Orders   DG Foot Complete Right       Follow up plan: Return if symptoms worsen or fail to improve.  Counseling provided for all of the vaccine components Orders Placed This Encounter  Procedures  . DG Foot Complete Right    Arville CareJoshua Kynadee Dam, MD [facility] 08/20/2016, 9:59 AM

## 2016-09-28 DIAGNOSIS — L709 Acne, unspecified: Secondary | ICD-10-CM | POA: Diagnosis not present

## 2016-09-28 DIAGNOSIS — Z79899 Other long term (current) drug therapy: Secondary | ICD-10-CM | POA: Diagnosis not present

## 2016-10-15 ENCOUNTER — Ambulatory Visit: Payer: 59 | Admitting: Pediatrics

## 2016-10-22 ENCOUNTER — Encounter: Payer: Self-pay | Admitting: Family Medicine

## 2016-10-22 ENCOUNTER — Ambulatory Visit (INDEPENDENT_AMBULATORY_CARE_PROVIDER_SITE_OTHER): Payer: 59 | Admitting: Family Medicine

## 2016-10-22 VITALS — BP 107/70 | HR 63 | Temp 97.1°F | Ht 68.0 in | Wt 180.0 lb

## 2016-10-22 DIAGNOSIS — R42 Dizziness and giddiness: Secondary | ICD-10-CM | POA: Diagnosis not present

## 2016-10-22 LAB — CMP14+EGFR
A/G RATIO: 2 (ref 1.2–2.2)
ALBUMIN: 4.5 g/dL (ref 3.5–5.5)
ALT: 10 IU/L (ref 0–44)
AST: 12 IU/L (ref 0–40)
Alkaline Phosphatase: 55 IU/L (ref 39–117)
BILIRUBIN TOTAL: 0.9 mg/dL (ref 0.0–1.2)
BUN / CREAT RATIO: 23 — AB (ref 9–20)
BUN: 22 mg/dL — ABNORMAL HIGH (ref 6–20)
CALCIUM: 9.7 mg/dL (ref 8.7–10.2)
CHLORIDE: 101 mmol/L (ref 96–106)
CO2: 26 mmol/L (ref 20–29)
Creatinine, Ser: 0.94 mg/dL (ref 0.76–1.27)
GFR, EST AFRICAN AMERICAN: 132 mL/min/{1.73_m2} (ref >59)
GFR, EST NON AFRICAN AMERICAN: 114 mL/min/{1.73_m2} (ref >59)
Globulin, Total: 2.3 g/dL (ref 1.5–4.5)
Glucose: 157 mg/dL — ABNORMAL HIGH (ref 65–99)
POTASSIUM: 4.1 mmol/L (ref 3.5–5.2)
Sodium: 141 mmol/L (ref 134–144)
Total Protein: 6.8 g/dL (ref 6.0–8.5)

## 2016-10-22 LAB — CBC WITH DIFFERENTIAL/PLATELET
BASOS: 0 %
Basophils Absolute: 0 10*3/uL (ref 0.0–0.2)
EOS (ABSOLUTE): 0.1 10*3/uL (ref 0.0–0.4)
EOS: 2 %
HEMATOCRIT: 42.2 % (ref 37.5–51.0)
HEMOGLOBIN: 14.6 g/dL (ref 13.0–17.7)
IMMATURE GRANS (ABS): 0 10*3/uL (ref 0.0–0.1)
IMMATURE GRANULOCYTES: 0 %
LYMPHS: 26 %
Lymphocytes Absolute: 2.1 10*3/uL (ref 0.7–3.1)
MCH: 31.4 pg (ref 26.6–33.0)
MCHC: 34.6 g/dL (ref 31.5–35.7)
MCV: 91 fL (ref 79–97)
MONOCYTES: 5 %
Monocytes Absolute: 0.4 10*3/uL (ref 0.1–0.9)
Neutrophils Absolute: 5.5 10*3/uL (ref 1.4–7.0)
Neutrophils: 67 %
Platelets: 236 10*3/uL (ref 150–379)
RBC: 4.65 x10E6/uL (ref 4.14–5.80)
RDW: 12.9 % (ref 12.3–15.4)
WBC: 8.2 10*3/uL (ref 3.4–10.8)

## 2016-10-22 LAB — URINALYSIS
Bilirubin, UA: NEGATIVE
Glucose, UA: NEGATIVE
KETONES UA: NEGATIVE
LEUKOCYTES UA: NEGATIVE
NITRITE UA: NEGATIVE
PROTEIN UA: NEGATIVE
RBC, UA: NEGATIVE
SPEC GRAV UA: 1.015 (ref 1.005–1.030)
Urobilinogen, Ur: 0.2 mg/dL (ref 0.2–1.0)
pH, UA: 8 — ABNORMAL HIGH (ref 5.0–7.5)

## 2016-10-22 NOTE — Progress Notes (Signed)
 Subjective:  Patient ID: Collin Ferguson, male    DOB: 01/29/1996  Age: 21 y.o. MRN: [id number]  CC: Dizziness (pt here today c/o dizzy spells and sweating at work)   HPI Collin Ferguson presents for For the last few months especially since he got started warming up in the spring he's been having episodes of getting hot shaky and lightheaded. He is working in the heat he is also about to graduate from [employer] and staying very busy. He tells me however that he's not she can sleep and he is getting exercise. However he is eating a lot of fast foods due to schedule. Episodes occurring every 1-2 weeks. They last about 10 min. They are relieved by eating. He admits to high carb diet including a lot of soda, junk food and biscuits for breakfast, etc. He tries to eat a balanced meal at supper. He drinks a lot of water.  Depression screen PHQ 2/9 10/22/2016 08/20/2016 06/02/2016  Decreased Interest 0 0 0  Down, Depressed, Hopeless 0 0 0  PHQ - 2 Score 0 0 0    History Collin Ferguson has a past medical history of Maxillary fracture (HCC) (04/05/2012); Nasal fracture (04/05/2012); PONV (postoperative nausea and vomiting); and Seasonal allergies.   He has a past surgical history that includes Femoral traction pin (Left, 02/14/1999); Extremity wire/pin removal (Left, 03/12/1999); Closed reduction nasal fracture (N/A, 04/11/2012); and Turbinate reduction (N/A, 04/11/2012).   His family history includes Heart disease in his father; Hypertension in his father.He reports that he has never smoked. He has never used smokeless tobacco. He reports that he does not drink alcohol or use drugs.    ROS Review of Systems  Constitutional: Positive for diaphoresis. Negative for chills, fever and unexpected weight change.  HENT: Negative for congestion, hearing loss, rhinorrhea and sore throat.   Eyes: Negative for visual disturbance.  Respiratory: Negative for cough and shortness of breath.   Cardiovascular: Negative  for chest pain.  Gastrointestinal: Negative for abdominal pain, constipation and diarrhea.  Genitourinary: Negative for dysuria and flank pain.  Musculoskeletal: Negative for arthralgias and joint swelling.  Skin: Negative for rash.  Neurological: Positive for dizziness, tremors and light-headedness. Negative for seizures, syncope, weakness and headaches.  Psychiatric/Behavioral: Negative for dysphoric mood and sleep disturbance.    Objective:  BP 107/70   Pulse 63   Temp (!) 97.1 F (36.2 C) (Oral)   Ht 5' 8" (1.727 m)   Wt 180 lb (81.6 kg)   BMI 27.37 kg/m   BP Readings from Last 3 Encounters:  10/22/16 107/70  08/20/16 123/74  06/02/16 129/87    Wt Readings from Last 3 Encounters:  10/22/16 180 lb (81.6 kg)  08/20/16 180 lb (81.6 kg)  06/02/16 175 lb (79.4 kg)     Physical Exam    Assessment & Plan:   Collin Ferguson was seen today for dizziness.  Diagnoses and all orders for this visit:  Dizzy spells -     CBC with Differential/Platelet -     CMP14+EGFR -     Urinalysis  Dizziness       I am having Collin Ferguson maintain his fexofenadine-pseudoephedrine and CLARAVIS. We will stop administering betamethasone acetate-betamethasone sodium phosphate.  Allergies as of 10/22/2016   No Known Allergies     Medication List       Accurate as of 10/22/16  9:40 AM. Always use your most recent med list.          CLARAVIS 40   MG capsule Generic drug:  ISOtretinoin Take 40 mg by mouth daily.   fexofenadine-pseudoephedrine 180-240 MG 24 hr tablet Commonly known as:  ALLEGRA-D 24 Take 1 tablet by mouth every evening. For allergy and congestion            Discharge Care Instructions        Start     Ordered   10/22/16 0000  CBC with Differential/Platelet     10/22/16 0936   10/22/16 0000  CMP14+EGFR     10/22/16 0936   10/22/16 0000  Urinalysis     10/22/16 0936     Increase water, protein foods. Decrease carbs.  Follow-up: Return if symptoms  worsen or fail to improve.   , M.D. 

## 2016-10-22 NOTE — Patient Instructions (Addendum)
Drink at least 64 ounces of water daily.Increase that to 96 when working in the heat and sweating.  Try to increase the protein foods and decrease the carbohydrates and fatty foods in your diet.

## 2016-10-27 LAB — HGB A1C W/O EAG: HEMOGLOBIN A1C: 4.7 % — AB (ref 4.8–5.6)

## 2016-10-27 LAB — SPECIMEN STATUS REPORT

## 2016-10-27 NOTE — Progress Notes (Signed)
Patient aware.

## 2016-11-03 DIAGNOSIS — Z79899 Other long term (current) drug therapy: Secondary | ICD-10-CM | POA: Diagnosis not present

## 2016-11-03 DIAGNOSIS — L709 Acne, unspecified: Secondary | ICD-10-CM | POA: Diagnosis not present

## 2016-12-09 DIAGNOSIS — L709 Acne, unspecified: Secondary | ICD-10-CM | POA: Diagnosis not present

## 2017-01-13 DIAGNOSIS — L709 Acne, unspecified: Secondary | ICD-10-CM | POA: Diagnosis not present

## 2017-02-16 DIAGNOSIS — L709 Acne, unspecified: Secondary | ICD-10-CM | POA: Diagnosis not present

## 2017-03-22 DIAGNOSIS — L709 Acne, unspecified: Secondary | ICD-10-CM | POA: Diagnosis not present

## 2017-04-21 DIAGNOSIS — M9902 Segmental and somatic dysfunction of thoracic region: Secondary | ICD-10-CM | POA: Diagnosis not present

## 2017-04-21 DIAGNOSIS — M9903 Segmental and somatic dysfunction of lumbar region: Secondary | ICD-10-CM | POA: Diagnosis not present

## 2017-04-25 DIAGNOSIS — M9902 Segmental and somatic dysfunction of thoracic region: Secondary | ICD-10-CM | POA: Diagnosis not present

## 2017-04-25 DIAGNOSIS — M9903 Segmental and somatic dysfunction of lumbar region: Secondary | ICD-10-CM | POA: Diagnosis not present

## 2017-04-26 DIAGNOSIS — L709 Acne, unspecified: Secondary | ICD-10-CM | POA: Diagnosis not present

## 2017-04-27 DIAGNOSIS — M9903 Segmental and somatic dysfunction of lumbar region: Secondary | ICD-10-CM | POA: Diagnosis not present

## 2017-04-27 DIAGNOSIS — M9902 Segmental and somatic dysfunction of thoracic region: Secondary | ICD-10-CM | POA: Diagnosis not present

## 2017-05-04 DIAGNOSIS — M9902 Segmental and somatic dysfunction of thoracic region: Secondary | ICD-10-CM | POA: Diagnosis not present

## 2017-05-04 DIAGNOSIS — M9903 Segmental and somatic dysfunction of lumbar region: Secondary | ICD-10-CM | POA: Diagnosis not present

## 2017-05-11 DIAGNOSIS — M9902 Segmental and somatic dysfunction of thoracic region: Secondary | ICD-10-CM | POA: Diagnosis not present

## 2017-05-11 DIAGNOSIS — M9903 Segmental and somatic dysfunction of lumbar region: Secondary | ICD-10-CM | POA: Diagnosis not present

## 2017-06-03 DIAGNOSIS — L709 Acne, unspecified: Secondary | ICD-10-CM | POA: Diagnosis not present

## 2017-08-08 DIAGNOSIS — M79674 Pain in right toe(s): Secondary | ICD-10-CM | POA: Diagnosis not present

## 2017-08-08 DIAGNOSIS — B351 Tinea unguium: Secondary | ICD-10-CM | POA: Diagnosis not present

## 2017-08-08 DIAGNOSIS — M79675 Pain in left toe(s): Secondary | ICD-10-CM | POA: Diagnosis not present

## 2017-09-07 DIAGNOSIS — M79674 Pain in right toe(s): Secondary | ICD-10-CM | POA: Diagnosis not present

## 2017-09-07 DIAGNOSIS — M79675 Pain in left toe(s): Secondary | ICD-10-CM | POA: Diagnosis not present

## 2017-09-07 DIAGNOSIS — B351 Tinea unguium: Secondary | ICD-10-CM | POA: Diagnosis not present

## 2017-10-07 DIAGNOSIS — M79675 Pain in left toe(s): Secondary | ICD-10-CM | POA: Diagnosis not present

## 2017-10-07 DIAGNOSIS — M79674 Pain in right toe(s): Secondary | ICD-10-CM | POA: Diagnosis not present

## 2017-10-07 DIAGNOSIS — B351 Tinea unguium: Secondary | ICD-10-CM | POA: Diagnosis not present

## 2017-11-11 DIAGNOSIS — M79675 Pain in left toe(s): Secondary | ICD-10-CM | POA: Diagnosis not present

## 2017-11-11 DIAGNOSIS — M79674 Pain in right toe(s): Secondary | ICD-10-CM | POA: Diagnosis not present

## 2017-11-11 DIAGNOSIS — L600 Ingrowing nail: Secondary | ICD-10-CM | POA: Diagnosis not present

## 2017-11-11 DIAGNOSIS — B351 Tinea unguium: Secondary | ICD-10-CM | POA: Diagnosis not present

## 2019-05-16 IMAGING — DX DG FOOT COMPLETE 3+V*R*
3 series · 3 of 3 positions shown · non-contrast
Comparison: None.

CLINICAL DATA: Came down on foot with a post hole digger yesterday.
Was wearing tennis shoesPain/swelling

EXAM:
RIGHT FOOT COMPLETE - 3+ VIEW

[foot ap]
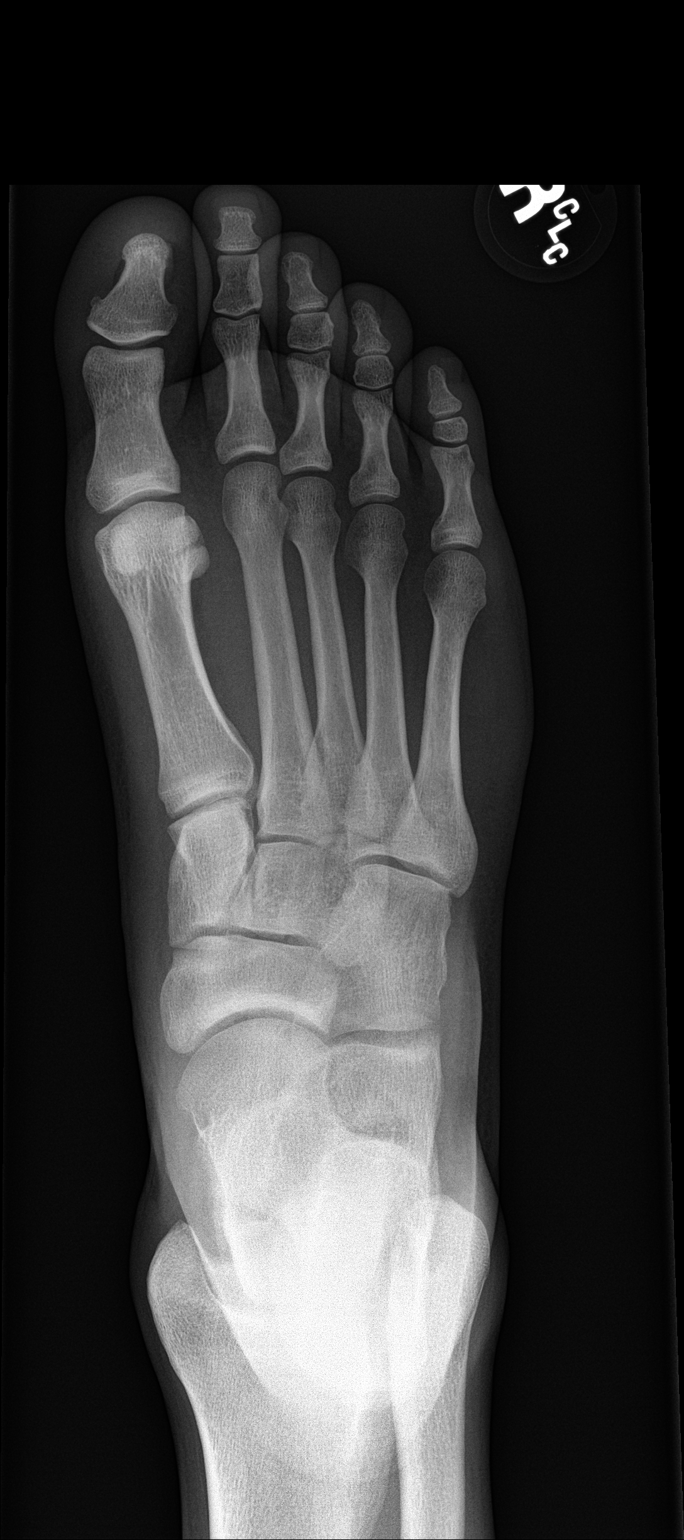

[foot obl]
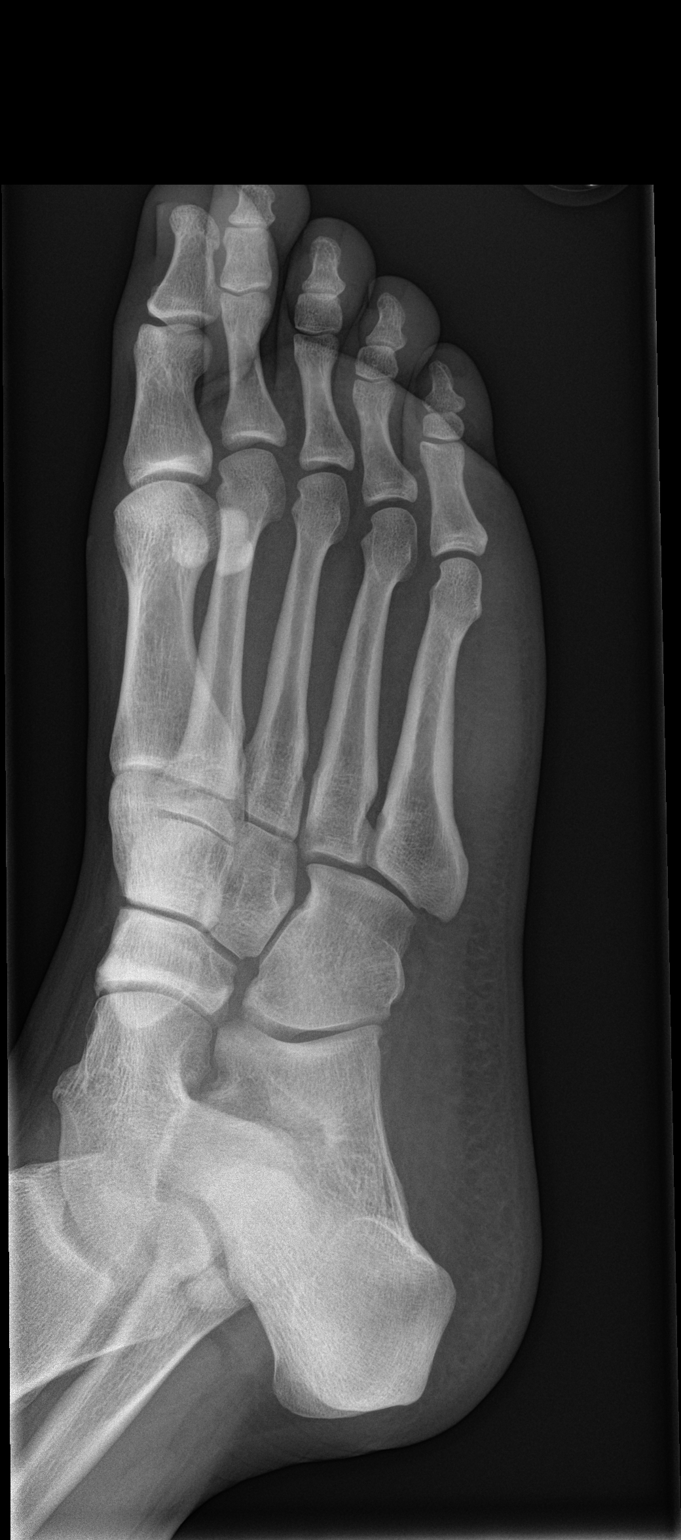

[foot lat]
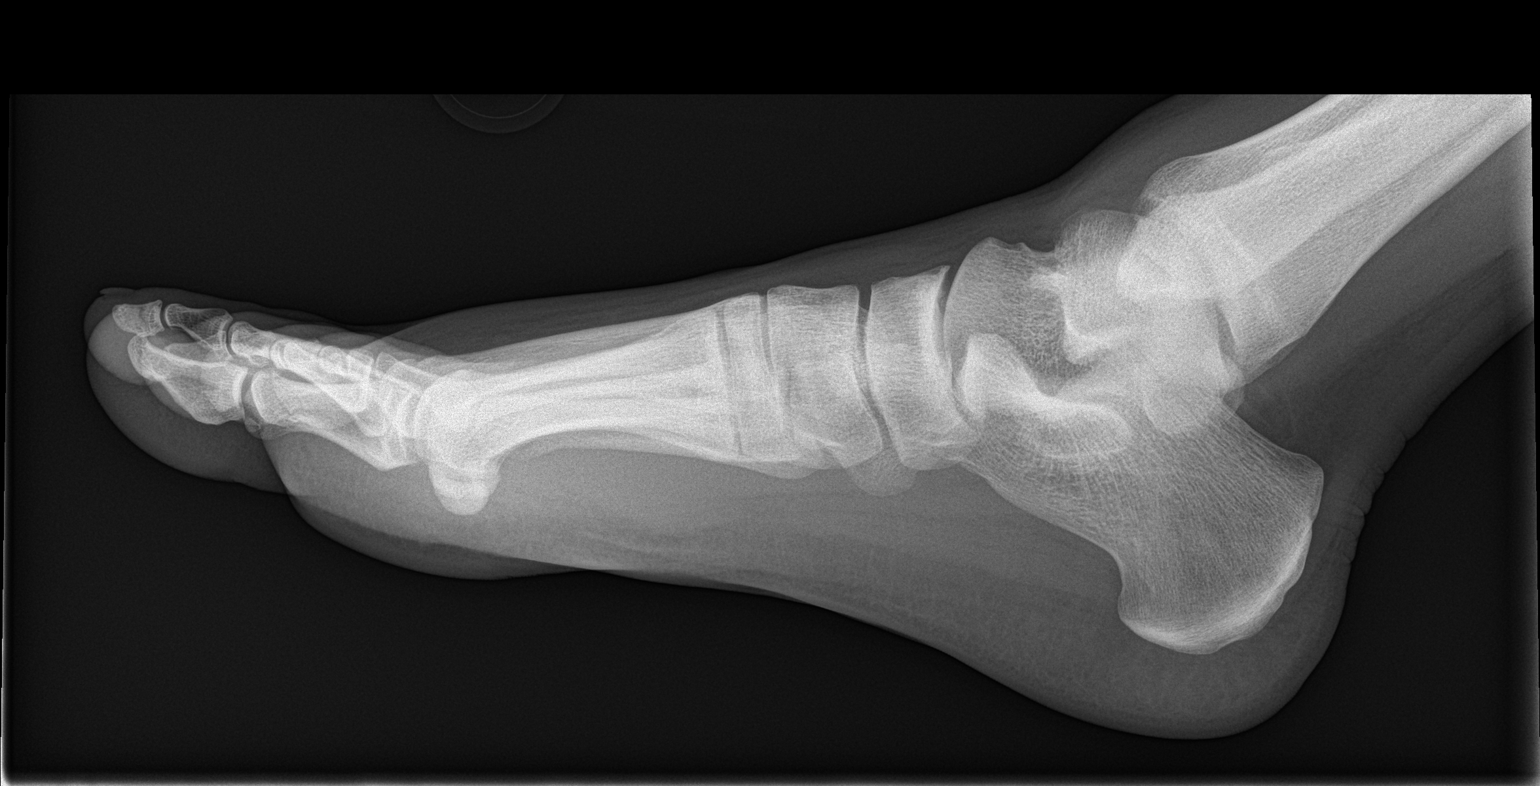

[3 of 3 positions shown; findings below may reference images not displayed]

FINDINGS: No fracture.  No bone lesion.

The joints are normally spaced and aligned.  No arthropathic change.

Soft tissues are unremarkable.
IMPRESSION: Negative.

## 2021-02-25 ENCOUNTER — Telehealth: Payer: Self-pay

## 2021-02-25 NOTE — Telephone Encounter (Signed)
NOTES SCANNED TO REFERRAL 

## 2021-03-16 NOTE — Progress Notes (Signed)
Cardiology Office Note:    Date:  03/17/2021   ID:  Collin Ferguson, DOB 1996/01/12, MRN [id number]  PCP:  Bennie Pierini, FNP   [facility] Providers Cardiologist:  Christell Constant, MD     Referring MD: April Manson, NP   CC: Prevention visit prior to basketball league Consulted for the evaluation of FH of CAD at the behest of Bennie Pierini, FNP   History of Present Illness:    Collin Ferguson is a 26 y.o. male with a hx of Early FH of CAD.  Patient notes that he is feeling good.  He is an Personnel officer and loves it .Has had no chest pain, chest pressure, chest tightness, chest stinging . Is going back to playing basketball and feels it but has been doing well.  No shortness of breath, DOE .  No PND or orthopnea.  No weight gain, leg swelling , or abdominal swelling.  No syncope or near syncope . Notes no palpitations or funny heart beats.     There is no history of SCD in the family F GF had MI in 58s associated with heavy smoking Father had MI in 15s associated with heavy smoking.  Still does a bit of chew, no smoking or vaping.  Past Medical History:  Diagnosis Date   Back pain    Family history of MI (myocardial infarction)    Maxillary fracture (HCC) 04/05/2012   hit in face by baseball   Nasal fracture 04/05/2012   PONV (postoperative nausea and vomiting)    Seasonal allergies     Past Surgical History:  Procedure Laterality Date   CLOSED REDUCTION NASAL FRACTURE N/A 04/11/2012   Procedure: CLOSED REDUCTION NASAL FRACTURE;  Surgeon: Drema Halon, MD;  Location: [facility];  Service: ENT;  Laterality: N/A;   EXTREMITY WIRE/PIN REMOVAL Left 03/12/1999   removal pin left distal femur; application spica cast   FEMORAL TRACTION PIN Left 02/14/1999   distal; application skeletal traction left lower extremity   TURBINATE REDUCTION N/A 04/11/2012   Procedure: TURBINATE REDUCTION;  Surgeon: Drema Halon, MD;   Location: [facility];  Service: ENT;  Laterality: N/A;    Current Medications: Current Meds  Medication Sig   fexofenadine-pseudoephedrine (ALLEGRA-D 24) 180-240 MG 24 hr tablet Take 1 tablet by mouth every evening. For allergy and congestion   tiZANidine (ZANAFLEX) 4 MG tablet Take 4 mg by mouth as needed.     Allergies:   Patient has no known allergies.   Social History   Socioeconomic History   Marital status: Single    Spouse name: Not on file   Number of children: Not on file   Years of education: Not on file   Highest education level: Not on file  Occupational History   Not on file  Tobacco Use   Smoking status: Never   Smokeless tobacco: Never  Vaping Use   Vaping Use: Never used  Substance and Sexual Activity   Alcohol use: No    Comment: occasionally   Drug use: No   Sexual activity: Not on file  Other Topics Concern   Not on file  Social History Narrative   Not on file   Social Determinants of Health   Financial Resource Strain: Not on file  Food Insecurity: Not on file  Transportation Needs: Not on file  Physical Activity: Not on file  Stress: Not on file  Social Connections: Not on file    Social: played  football, baseball, and basketball, 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  ROS:   Please see the history of present illness.     All other systems reviewed and are negative.  EKGs/Labs/Other Studies Reviewed:    The following studies were reviewed today:  EKG:  EKG is  ordered today.  The ekg ordered today demonstrates  03/17/21: Sinus bradycardia rate 54  Recent Labs: No results found for requested labs within last 8760 hours.  Recent Lipid Panel No results found for: CHOL, TRIG, HDL, CHOLHDL, VLDL, LDLCALC,  LDLDIRECT      Physical Exam:    VS:  BP 122/87    Pulse (!) 54    Ht 5\' 8"  (1.727 m)    Wt 93 kg    SpO2 98%    BMI 31.17 kg/m     Wt Readings from Last 3 Encounters:  03/17/21 93 kg  10/22/16 81.6 kg  08/20/16 81.6 kg    Gen: no distress   Neck: No JVD,  Cardiac: No Rubs or Gallops, no Murmur, bradycardia, +2 radial pulses Respiratory: Clear to auscultation bilaterally, normal effort, normal  respiratory rate GI: Soft, nontender, non-distended  MS: No  edema;  moves all extremities Integument: Skin feels warm Neuro:  At time of evaluation, alert and oriented to person/place/time/situation  Psych: Normal affect, patient feels w   ASSESSMENT:    1. Family history of early CAD   2. Tobacco abuse    PLAN:    Cardiac Preventive Visit Early Fhx of CAD Tobacco abuse (chew) AHA Life's Simple Seven- People with at least five ideal Lifes Simple 7 metrics had a 78% reduced risk for heart-related death compared to people with no ideal metrics. - Discussed recommendation of Mediterranean and Dash Diets; discussed the evidence behind vegan diets and about sugar sweetened beverages - Discussed recommendations for 150 minutes weekly exercise (at goal) - Discussed the important of blood sugar and blood pressure control - offered Lp(a) and discussed cholesterol management - stress the important of a tobacco free environment - though BMI is not a perfect measurement in all patient populations; a BMI < 30 can improve cardiac outcomes; present BMI is 31; we had discussed cutting back sugar sweetened drinks  PRN f/u unless elevated Lp(a)          Medication Adjustments/Labs and Tests Ordered: Current medicines are reviewed at length with the patient today.  Concerns regarding medicines are outlined above.  Orders Placed This Encounter  Procedures   Lipid panel   Lipoprotein A (LPA)   EKG 12-Lead   No orders of the defined types were placed in this encounter.   Patient  Instructions  Medication Instructions:  Your physician recommends that you continue on your current medications as directed. Please refer to the Current Medication list given to you today.  *If you need a refill on your cardiac medications before your next appointment, please call your pharmacy*   Lab Work: TODAY: Lipid panel and Lipoprotein a If you have labs (blood work) drawn today and your tests are completely normal, you will receive your results only by: [place] (if you have MyChart) OR A paper copy in the mail If you have any lab test that is abnormal or we need to change your treatment, we will call  you to review the results.   Testing/Procedures: NONE   Follow-Up: Based on results At [facility], you and your health needs are our priority.  As part of our continuing mission to provide you with exceptional heart care, we have created designated Provider Care Teams.  These Care Teams include your primary Cardiologist (physician) and Advanced Practice Providers (APPs -  Physician Assistants and Nurse Practitioners) who all work together to provide you with the care you need, when you need it.  We recommend signing up for the patient portal called "MyChart".  Sign up information is provided on this After Visit Summary.  MyChart is used to connect with patients for Virtual Visits (Telemedicine).  Patients are able to view lab/test results, encounter notes, upcoming appointments, etc.  Non-urgent messages can be sent to your provider as well.   To learn more about what you can do with MyChart, go to [email or web address].     Provider:   Werner Lean, MD       Signed, Werner Lean, MD  03/17/2021 9:00 AM    [place]

## 2021-03-17 ENCOUNTER — Ambulatory Visit: Payer: BC Managed Care – PPO | Admitting: Internal Medicine

## 2021-03-17 ENCOUNTER — Other Ambulatory Visit: Payer: Self-pay

## 2021-03-17 ENCOUNTER — Encounter: Payer: Self-pay | Admitting: Internal Medicine

## 2021-03-17 VITALS — BP 122/87 | HR 54 | Ht 68.0 in | Wt 205.0 lb

## 2021-03-17 DIAGNOSIS — Z8249 Family history of ischemic heart disease and other diseases of the circulatory system: Secondary | ICD-10-CM | POA: Diagnosis not present

## 2021-03-17 DIAGNOSIS — Z720 Tobacco use: Secondary | ICD-10-CM

## 2021-03-17 NOTE — Patient Instructions (Signed)
Medication Instructions:  Your physician recommends that you continue on your current medications as directed. Please refer to the Current Medication list given to you today.  *If you need a refill on your cardiac medications before your next appointment, please call your pharmacy*   Lab Work: TODAY: Lipid panel and Lipoprotein a If you have labs (blood work) drawn today and your tests are completely normal, you will receive your results only by: [place] (if you have MyChart) OR A paper copy in the mail If you have any lab test that is abnormal or we need to change your treatment, we will call you to review the results.   Testing/Procedures: NONE   Follow-Up: Based on results At [facility], you and your health needs are our priority.  As part of our continuing mission to provide you with exceptional heart care, we have created designated Provider Care Teams.  These Care Teams include your primary Cardiologist (physician) and Advanced Practice Providers (APPs -  Physician Assistants and Nurse Practitioners) who all work together to provide you with the care you need, when you need it.  We recommend signing up for the patient portal called "MyChart".  Sign up information is provided on this After Visit Summary.  MyChart is used to connect with patients for Virtual Visits (Telemedicine).  Patients are able to view lab/test results, encounter notes, upcoming appointments, etc.  Non-urgent messages can be sent to your provider as well.   To learn more about what you can do with MyChart, go to [email or web address].     Provider:   Werner Lean, MD

## 2021-03-18 LAB — LIPID PANEL
Chol/HDL Ratio: 4.1 ratio (ref 0.0–5.0)
Cholesterol, Total: 183 mg/dL (ref 100–199)
HDL: 45 mg/dL (ref >39)
LDL Chol Calc (NIH): 116 mg/dL — ABNORMAL HIGH (ref 0–99)
Triglycerides: 123 mg/dL (ref 0–149)
VLDL Cholesterol Cal: 22 mg/dL (ref 5–40)

## 2021-03-18 LAB — LIPOPROTEIN A (LPA): Lipoprotein (a): 64.9 nmol/L (ref <75.0)

## 2022-02-25 ENCOUNTER — Emergency Department (HOSPITAL_COMMUNITY): Payer: BC Managed Care – PPO

## 2022-02-25 ENCOUNTER — Emergency Department (HOSPITAL_COMMUNITY)
Admission: EM | Admit: 2022-02-25 | Discharge: 2022-02-25 | Disposition: A | Discharge status: Home / Self Care | Payer: BC Managed Care – PPO | Attending: Emergency Medicine | Admitting: Emergency Medicine

## 2022-02-25 ENCOUNTER — Encounter (HOSPITAL_COMMUNITY): Payer: Self-pay | Admitting: Emergency Medicine

## 2022-02-25 ENCOUNTER — Other Ambulatory Visit: Payer: Self-pay

## 2022-02-25 DIAGNOSIS — S61321A Laceration with foreign body of left index finger with damage to nail, initial encounter: Secondary | ICD-10-CM | POA: Diagnosis not present

## 2022-02-25 DIAGNOSIS — S60941A Unspecified superficial injury of left index finger, initial encounter: Secondary | ICD-10-CM | POA: Diagnosis present

## 2022-02-25 DIAGNOSIS — Z23 Encounter for immunization: Secondary | ICD-10-CM | POA: Diagnosis not present

## 2022-02-25 DIAGNOSIS — S61412A Laceration without foreign body of left hand, initial encounter: Secondary | ICD-10-CM | POA: Diagnosis not present

## 2022-02-25 DIAGNOSIS — S61211A Laceration without foreign body of left index finger without damage to nail, initial encounter: Secondary | ICD-10-CM

## 2022-02-25 DIAGNOSIS — W11XXXA Fall on and from ladder, initial encounter: Secondary | ICD-10-CM | POA: Insufficient documentation

## 2022-02-25 MED ORDER — LIDOCAINE HCL 2 % IJ SOLN
INTRAMUSCULAR | Status: AC
  Filled 2022-02-25: qty 20

## 2022-02-25 MED ORDER — HYDROCODONE-ACETAMINOPHEN 5-325 MG PO TABS
1.0000 | ORAL_TABLET | Freq: Once | ORAL | Status: AC
  Administered 2022-02-25: 1 via ORAL
  Filled 2022-02-25: qty 1

## 2022-02-25 MED ORDER — DOXYCYCLINE HYCLATE 100 MG PO TABS
100.0000 mg | ORAL_TABLET | Freq: Once | ORAL | Status: AC
  Administered 2022-02-25: 100 mg via ORAL
  Filled 2022-02-25: qty 1

## 2022-02-25 MED ORDER — HYDROCODONE-ACETAMINOPHEN 5-325 MG PO TABS
2.0000 | ORAL_TABLET | Freq: Once | ORAL | Status: AC
  Administered 2022-02-25: 2 via ORAL
  Filled 2022-02-25: qty 2

## 2022-02-25 MED ORDER — DOXYCYCLINE HYCLATE 100 MG PO CAPS: 100.0000 mg | ORAL_CAPSULE | Freq: Two times a day (BID) | ORAL | 0 refills | Status: AC

## 2022-02-25 MED ORDER — TETANUS-DIPHTH-ACELL PERTUSSIS 5-2.5-18.5 LF-MCG/0.5 IM SUSY
0.5000 mL | PREFILLED_SYRINGE | Freq: Once | INTRAMUSCULAR | Status: AC
  Administered 2022-02-25: 0.5 mL via INTRAMUSCULAR
  Filled 2022-02-25: qty 0.5

## 2022-02-25 MED ORDER — CEPHALEXIN 250 MG PO CAPS: 500.0000 mg | ORAL_CAPSULE | Freq: Once | ORAL | Status: DC

## 2022-02-25 MED ORDER — LIDOCAINE-EPINEPHRINE (PF) 2 %-1:200000 IJ SOLN
20.0000 mL | Freq: Once | INTRAMUSCULAR | Status: AC
  Administered 2022-02-25: 20 mL
  Filled 2022-02-25: qty 20

## 2022-02-25 MED ORDER — BACITRACIN ZINC 500 UNIT/GM EX OINT: TOPICAL_OINTMENT | Freq: Two times a day (BID) | CUTANEOUS | Status: DC

## 2022-02-25 MED ORDER — OXYCODONE-ACETAMINOPHEN 5-325 MG PO TABS: 1.0000 | ORAL_TABLET | Freq: Four times a day (QID) | ORAL | 0 refills | Status: AC | PRN

## 2022-02-25 NOTE — ED Triage Notes (Signed)
Pt reports he fell 62ft from ladder onto his right shoulder. Pt reports on the way down he grabbed sheet metal with he left hand. Pt has laceration to the left first finger and palm of his head. Active bleeding from the hand at this time.

## 2022-02-25 NOTE — ED Provider Notes (Signed)
[place] EM[facility] DEPARTMENT AT [facility] Provider Note   CSN: [id number] Arrival date & time: 02/25/22  1429     History  Chief Complaint  Patient presents with   Laceration   HPI Collin Ferguson is a 27 y.o. male presenting for hand laceration.  Patient was 6 feet up on a ladder when he fell and tried to catch himself by grabbing sheet-metal with his right hand.  He lacerated the medial aspect of his right palm.  States there was "a lot of bleeding "initially.  He was able to stop the bleeding with some napkins and some tape.  Endorses normal range of motion and sensation in his fingers. Denies head injury or LOC.   Laceration      Home Medications Prior to Admission medications   Medication Sig Start Date End Date Taking? Authorizing Provider  doxycycline (VIBRAMYCIN) 100 MG capsule Take 1 capsule (100 mg total) by mouth 2 (two) times daily. 02/25/22  Yes Gareth Eagle, PA-C  fexofenadine-pseudoephedrine (ALLEGRA-D 24) 180-240 MG 24 hr tablet Take 1 tablet by mouth every evening. For allergy and congestion 06/02/16   Mechele Claude, MD  tiZANidine (ZANAFLEX) 4 MG tablet Take 4 mg by mouth as needed. 11/21/20   [provider]      Allergies    Patient has no known allergies.    Review of Systems   Review of Systems  Musculoskeletal:        Left hand pain and laceration    Physical Exam Updated Vital Signs BP 117/75 (BP Location: Right Arm)   Pulse 77   Temp 98.2 F (36.8 C) (Oral)   Resp 18   SpO2 100%  Physical Exam Constitutional:      Appearance: Normal appearance.  HENT:     Head: Normocephalic.     Nose: Nose normal.  Eyes:     Conjunctiva/sclera: Conjunctivae normal.  Pulmonary:     Effort: Pulmonary effort is normal.  Musculoskeletal:     Right shoulder: No swelling, deformity or tenderness.     Left hand: Swelling and laceration present. Normal range of motion.     Comments: Well-approximated laceration noted about the  volar aspect of the left index finger approximately about the DIP joint.  It is U-shaped and approximately 2 cm in size.  Laceration note noted about the volar aspect of the medial palm on the left hand.  It is gaping, bleeding, able to visualize muscle tissue.  Approximately 4 cm long.  Superficial abrasion noted over the right shoulder.  Shoulders are symmetric.  No obvious deformity about the shoulder joint.  Full range of motion of both shoulders without pain.  Neurological:     Mental Status: He is alert.  Psychiatric:        Mood and Affect: Mood normal.     ED Results / Procedures / Treatments   Labs (all labs ordered are listed, but only abnormal results are displayed) Labs Reviewed - No data to display  EKG None  Radiology DG Hand Complete Left  Result Date: 02/25/2022 CLINICAL DATA:  Trauma. Left hand palmar laceration after falling from ladder and grabbing a sheet metal along the way down. EXAM: LEFT HAND - COMPLETE 3+ VIEW COMPARISON:  None Available. FINDINGS: Three views. There is no evidence of fracture or dislocation. There is no evidence of arthropathy or other focal bone abnormality. Volar soft tissue swelling. No radiopaque foreign body. IMPRESSION: No acute fracture or radiopaque foreign body. Electronically  Signed   By: Emmit Alexanders M.D.   On: 02/25/2022 15:24    Procedures .Marland KitchenLaceration Repair  Date/Time: 02/25/2022 5:35 PM  Performed by: Harriet Pho, PA-C Authorized by: Harriet Pho, PA-C   Consent:    Consent obtained:  Verbal   Consent given by:  Patient   Risks discussed:  Infection and retained foreign body   Alternatives discussed:  No treatment Universal protocol:    Procedure explained and questions answered to patient or proxy's satisfaction: yes     Relevant documents present and verified: yes     Patient identity confirmed:  Verbally with patient and arm band Anesthesia:    Anesthesia method:  Local infiltration   Local anesthetic:   Lidocaine 2% WITH epi Laceration details:    Location:  Hand   Hand location:  L palm   Length (cm):  4   Depth (mm):  10 Pre-procedure details:    Preparation:  Patient was prepped and draped in usual sterile fashion Exploration:    Hemostasis achieved with:  Direct pressure and epinephrine   Imaging obtained: x-ray     Imaging outcome: foreign body not noted     Wound exploration: wound explored through full range of motion     Wound extent: areolar tissue violated     Contaminated: no   Treatment:    Area cleansed with:  Povidone-iodine and saline   Amount of cleaning:  Extensive   Irrigation solution:  Sterile saline   Irrigation method:  Pressure wash   Debridement:  Minimal   Undermining:  None Skin repair:    Repair method:  Sutures   Suture size:  3-0   Suture material:  Prolene   Suture technique:  Simple interrupted   Number of sutures:  6 Approximation:    Approximation:  Close Repair type:    Repair type:  Intermediate Post-procedure details:    Dressing:  Antibiotic ointment   Procedure completion:  Tolerated well, no immediate complications .Marland KitchenLaceration Repair  Date/Time: 02/25/2022 5:38 PM  Performed by: Harriet Pho, PA-C Authorized by: Harriet Pho, PA-C   Consent:    Consent obtained:  Verbal   Consent given by:  Patient   Risks discussed:  Infection, pain, retained foreign body and poor cosmetic result   Alternatives discussed:  No treatment Universal protocol:    Procedure explained and questions answered to patient or proxy's satisfaction: yes     Relevant documents present and verified: yes     Patient identity confirmed:  Verbally with patient and arm band Anesthesia:    Anesthesia method:  Local infiltration   Local anesthetic:  Lidocaine 2% w/o epi Laceration details:    Location:  Finger   Finger location:  L index finger   Length (cm):  2   Depth (mm):  8 Pre-procedure details:    Preparation:  Patient was prepped and  draped in usual sterile fashion Exploration:    Hemostasis achieved with:  Direct pressure   Imaging obtained: x-ray     Imaging outcome: foreign body not noted     Wound exploration: wound explored through full range of motion     Wound extent: areolar tissue violated     Contaminated: no   Treatment:    Area cleansed with:  Povidone-iodine and saline   Amount of cleaning:  Extensive   Irrigation solution:  Sterile saline   Irrigation method:  Pressure wash   Debridement:  Minimal   Undermining:  None  Skin repair:    Repair method:  Sutures   Suture size:  4-0   Suture material:  Prolene   Suture technique:  Simple interrupted   Number of sutures:  5 Approximation:    Approximation:  Close Repair type:    Repair type:  Intermediate Post-procedure details:    Dressing:  Antibiotic ointment and non-adherent dressing   Procedure completion:  Tolerated well, no immediate complications     Medications Ordered in ED Medications  HYDROcodone-acetaminophen (NORCO/VICODIN) 5-325 MG per tablet 1 tablet (has no administration in time range)  doxycycline (VIBRA-TABS) tablet 100 mg (has no administration in time range)  Tdap (BOOSTRIX) injection 0.5 mL (0.5 mLs Intramuscular Given 02/25/22 1523)  HYDROcodone-acetaminophen (NORCO/VICODIN) 5-325 MG per tablet 2 tablet (2 tablets Oral Given 02/25/22 1524)  lidocaine-EPINEPHrine (XYLOCAINE W/EPI) 2 %-1:200000 (PF) injection 20 mL (20 mLs Infiltration Given 02/25/22 1517)  lidocaine (XYLOCAINE) 2 % (with pres) injection (  Given 02/25/22 1644)    ED Course/ Medical Decision Making/ A&P Clinical Course as of 02/25/22 1735  Thu Feb 25, 2022  1646 DG Hand Complete Left [JR]    Clinical Course User Index [JR] Harriet Pho, PA-C                             Medical Decision Making Amount and/or Complexity of Data Reviewed Radiology: ordered. Decision-making details documented in ED Course.  Risk Prescription drug  management.  27 year old male who is well-appearing and hemoglobin stable presenting for laceration to his left hand and finger.  Physical exam was notable for laceration to the left index finger and the medial aspect of the left palm.  I personally reviewed and interpreted x-ray of the left hand which did not reveal concern for foreign body, dislocation or fracture.  Laceration repairs were well-tolerated.  Treated pain with Percocet.  Treated empirically for infection with doxycycline.  Sent doxycycline to his pharmacy along with a few tablets of Percocet for acute pain at home.  Advised him to follow-up with his PCP in 5 to 7 days.  Discussed appropriate return precautions.         Final Clinical Impression(s) / ED Diagnoses Final diagnoses:  Laceration of left index finger, foreign body presence unspecified, nail damage status unspecified, initial encounter  Laceration of left hand without foreign body, initial encounter    Rx / DC Orders ED Discharge Orders          Ordered    doxycycline (VIBRAMYCIN) 100 MG capsule  2 times daily        02/25/22 1735              Harriet Pho, PA-C 02/25/22 1826    Carmin Muskrat, MD 03/01/22 [phone]

## 2022-02-25 NOTE — ED Notes (Signed)
Patient transported to X-ray 

## 2022-02-25 NOTE — ED Notes (Signed)
PA Quentin Cornwall verbal order of 2% Lidocaine without epi.  No new orders at this time.

## 2022-02-25 NOTE — Discharge Instructions (Addendum)
Laceration repair of your finger and hand went well. I do recommend that you follow-up with your PCP in the next 5 to 7 days for wound reevaluation.    Sutured repair Keep the laceration site dry for the next 24 hours and leave the dressing in place. After 24 hours you may remove the dressing and gently clean the laceration site with antibacterial soap and warm water. Do not scrub the area. Do not soak the area and water for long periods of time. Don't use hydrogen peroxide, iodine-based solutions, or alcohol, which can slow healing, and will probably be painful! Apply topical bacitracin 1-2 times per day for the next 3-5 days. Return to the PCP or emergency department in 7 days for removal of the sutures.  You should return sooner for any signs of infection which would include increased redness around the wound, increased swelling, new drainage of yellow pus.    Adhesive tape repair Tape strips have the advantage of being less painful to apply and lower risk of infection, but do require more caution on your part, as they are more fragile than other skin closure techniques.  It's important to: -Keep the tape clean and dry -Avoid picking at the tape or rubbing the area -Avoid soaking in water (showering is okay-bathing is not) -The tape strips will fall off on their own in about 5-7 days (if they don't, you can gently remove them or soak the wound in water at this time to loosen them).  At this time, scar tissue will be forming under the surface of the wound and your body will do the rest of the work of healing.

## 2022-02-26 ENCOUNTER — Telehealth: Payer: Self-pay

## 2022-02-26 NOTE — Telephone Encounter (Signed)
Transition Care Management Follow-up Telephone Call Date of discharge and from where: [facility] 02/25/2022 How have you been since you were released from the hospital? Still has pain Any questions or concerns? No  Items Reviewed: Did the pt receive and understand the discharge instructions provided? Yes  Medications obtained and verified? Yes  Other? No  Any new allergies since your discharge? No  Dietary orders reviewed? Yes Do you have support at home? Yes   Home Care and Equipment/Supplies: Were home health services ordered? no If so, what is the name of the agency? N/a  Has the agency set up a time to come to the patient's home? not applicable Were any new equipment or medical supplies ordered?  No What is the name of the medical supply agency? N/a Were you able to get the supplies/equipment? not applicable Do you have any questions related to the use of the equipment or supplies? No  Functional Questionnaire: (I = Independent and D = Dependent) ADLs: I  Bathing/Dressing- I  Meal Prep- I  Eating- I  Maintaining continence- I  Transferring/Ambulation- I  Managing Meds- I  Follow up appointments reviewed:  PCP Hospital f/u appt confirmed? No   Specialist Hospital f/u appt confirmed? No  Are transportation arrangements needed? No  If their condition worsens, is the pt aware to call PCP or go to the Emergency Dept.? Yes Was the patient provided with contact information for the PCP's office or ED? Yes Was to pt encouraged to call back with questions or concerns? Yes Juanda Crumble, LPN [facility] Direct Dial [phone]
